# Patient Record
Sex: Male | Born: 1985 | Race: White | Hispanic: No | Marital: Single | Smoking: Never smoker
Health system: Southern US, Community
[De-identification: ages and names within clinical notes are randomized; demographics above are authoritative.]

## PROBLEM LIST (undated history)

## (undated) ENCOUNTER — Ambulatory Visit (HOSPITAL_COMMUNITY)
Admission: EM | Discharge status: Absent without leave | Payer: No Typology Code available for payment source | Source: Home / Self Care

## (undated) DIAGNOSIS — I10 Essential (primary) hypertension: Secondary | ICD-10-CM

## (undated) DIAGNOSIS — F419 Anxiety disorder, unspecified: Secondary | ICD-10-CM

## (undated) HISTORY — DX: Anxiety disorder, unspecified: F41.9

---

## 2014-06-02 ENCOUNTER — Encounter: Payer: Self-pay | Admitting: Primary Care

## 2014-06-02 ENCOUNTER — Encounter (INDEPENDENT_AMBULATORY_CARE_PROVIDER_SITE_OTHER): Payer: Self-pay

## 2014-06-02 ENCOUNTER — Ambulatory Visit (INDEPENDENT_AMBULATORY_CARE_PROVIDER_SITE_OTHER): Payer: No Typology Code available for payment source | Admitting: Primary Care

## 2014-06-02 VITALS — BP 138/88 | HR 90 | Temp 98.6°F | Ht 71.75 in | Wt 237.8 lb

## 2014-06-02 DIAGNOSIS — F410 Panic disorder [episodic paroxysmal anxiety] without agoraphobia: Secondary | ICD-10-CM | POA: Diagnosis not present

## 2014-06-02 MED ORDER — CLONAZEPAM 1 MG PO TABS: ORAL_TABLET | ORAL | Status: DC

## 2014-06-02 NOTE — Progress Notes (Signed)
Pre visit review using our clinic review tool, if applicable. No additional management support is needed unless otherwise documented below in the visit note. 

## 2014-06-02 NOTE — Assessment & Plan Note (Addendum)
Infrequent for the most part but due to recent stress has had to use Clonazepam more frequently. Increased dose to 1mg  and instructed to take once daily with his panic attacks. We discussed that the clonazepam was a temporary fix and that he will most likely need to be managed with an SSRI or SNRI soon if symptoms continue to progress (and needing his clonazepam more often). Was once on Zoloft, didn't like the way he felt. Refilled his clonazepam and will have controlled substance contract and UDS performed prior to providing prescription. He was unable to do this today, so his prescription was held until he can get this completed.

## 2014-06-02 NOTE — Progress Notes (Signed)
Subjective:    Patient ID: Lucas Barrett, male    DOB: 06/30/1985, 29 y.o.   MRN: [id number]  HPI  Lucas Barrett is a 29 year old male who presents today to establish care and discuss the problems mentioned below. Will obtain old records.  1) Elevated Blood Pressure Readings: He's had a few sporadic readings at his [facility] during his annual labs. He has no symptoms of chest pain, shortness of breath, daily headaches or edema.   2) Migraines: Will have them once every 2-3 months. He has a prescription for sumatriptan which he uses only during a severe migraine. During his migraines he will have photophobia, phonophobia, and will have to seclude himself in a dark room. 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 When he does have headaches other than migraines he will takeTylenol and ibuprofen with relief.  3) Panic Attacks: He was diagnosed 2 years ago and was placed on clonazepam 0.5mg  by his prior PCP for panic attacks. His attack started while he was on the phone with a customer and suddenly felt palpations and severe anxiety. He reports his panic attacks are once to twice monthly but lately has experienced them more frequently (1-2 times weekly). He's going through some additional stress at the moment due to planning a wedding and finding somewhere to live due to an upcoming lease expiration. He's tried Zoloft in the past for 6 months and reports not liking the way he felt. He's been out of his Clonazepam for 2 weeks, but has not needed it. He's had to take two of the 0.5mg  tablets lately and is requesting to take one of the 1mg  tablet instead.  Review of Systems  Constitutional: Negative for fatigue and unexpected weight change.  HENT: Negative for rhinorrhea.   Respiratory: Negative for cough and shortness of breath.   Cardiovascular: Negative for chest pain.  Gastrointestinal: Negative for diarrhea and constipation.  Genitourinary:  Negative for dysuria and frequency.  Musculoskeletal: Negative for myalgias, back pain and arthralgias.       Chronic upper back pain from picking up boxes. Tolerable, controlled.  Skin: Negative for rash.  Neurological: Positive for headaches. Negative for dizziness.  Hematological: Negative for adenopathy.  Psychiatric/Behavioral: Negative for suicidal ideas.       Denies Depression. See HPI for anxiety.       Past Medical History  Diagnosis Date  . Anxiety     History   Social History  . Marital Status: Single    Spouse Name: N/A  . Number of Children: N/A  . Years of Education: N/A   Occupational History  . Not on file.   Social History Main Topics  . Smoking status: Never Smoker   . Smokeless tobacco: Not on file  . Alcohol Use: No  . Drug Use: No  . Sexual Activity: Not on file   Other Topics Concern  . Not on file   Social History Narrative   Engaged.   [place]   Works for [employer] for 3 years.   Had 2 dogs.   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   Enjoys hiking, selling items on [employer].    History reviewed. No pertinent past surgical history.  Family History  Problem Relation Age of Onset  . Hypertension Mother     No Known Allergies  No current outpatient prescriptions on file prior to visit.  No current facility-administered medications on file prior to visit.    BP 138/88 mmHg  Pulse 90  Temp(Src) 98.6 F (37 C) (Oral)  Ht 5' 11.75" (1.822 m)  Wt 237 lb 12.8 oz (107.865 kg)  BMI 32.49 kg/m2  SpO2 97%    Objective:   Physical Exam  Constitutional: He is oriented to person, place, and time. He appears well-developed.  HENT:  Head: Normocephalic.  Right Ear: External ear normal. Tympanic membrane is not erythematous and not bulging.  Left Ear: External ear normal. Tympanic membrane is not erythematous and not bulging.  Nose: Nose normal.  Mouth/Throat: Oropharynx is clear and moist.   Eyes: Conjunctivae are normal. Pupils are equal, round, and reactive to light.  Neck: Neck supple.  Cardiovascular: Normal rate and regular rhythm.   Pulmonary/Chest: Effort normal and breath sounds normal.  Abdominal: Soft. Bowel sounds are normal. There is no tenderness.  Lymphadenopathy:    He has no cervical adenopathy.  Neurological: He is alert and oriented to person, place, and time.  Skin: Skin is warm and dry.  Psychiatric: He has a normal mood and affect.          Assessment & Plan:

## 2014-06-02 NOTE — Patient Instructions (Addendum)
Refills have been provided for your Clonazepam. Please continue to use these as needed for panic attacks and continue using relaxation techniques to prevent attacks. This medication can become highly addictive, so use when needed.  Follow up in 6 weeks for re-evaluation of anxiety and for physical exam. Welcome to [facility]!  Panic Attacks Panic attacks are sudden, short-livedsurges of severe anxiety, fear, or discomfort. They may occur for no reason when you are relaxed, when you are anxious, or when you are sleeping. Panic attacks may occur for a number of reasons:   Healthy people occasionally have panic attacks in extreme, life-threatening situations, such as war or natural disasters. Normal anxiety is a protective mechanism of the body that helps [place] react to danger (fight or flight response).  Panic attacks are often seen with anxiety disorders, such as panic disorder, social anxiety disorder, generalized anxiety disorder, and phobias. Anxiety disorders cause excessive or uncontrollable anxiety. They may interfere with your relationships or other life activities.  Panic attacks are sometimes seen with other mental illnesses, such as depression and posttraumatic stress disorder.  Certain medical conditions, prescription medicines, and drugs of abuse can cause panic attacks. SYMPTOMS  Panic attacks start suddenly, peak within 20 minutes, and are accompanied by four or more of the following symptoms:  Pounding heart or fast heart rate (palpitations).  Sweating.  Trembling or shaking.  Shortness of breath or feeling smothered.  Feeling choked.  Chest pain or discomfort.  Nausea or strange feeling in your stomach.  Dizziness, light-headedness, or feeling like you will faint.  Chills or hot flushes.  Numbness or tingling in your lips or hands and feet.  Feeling that things are not real or feeling that you are not yourself.  Fear of losing control or going crazy.  Fear of  dying. Some of these symptoms can mimic serious medical conditions. For example, you may think you are having a heart attack. Although panic attacks can be very scary, they are not life threatening. DIAGNOSIS  Panic attacks are diagnosed through an assessment by your health care provider. Your health care provider will ask questions about your symptoms, such as where and when they occurred. Your health care provider will also ask about your medical history and use of alcohol and drugs, including prescription medicines. Your health care provider may order blood tests or other studies to rule out a serious medical condition. Your health care provider may refer you to a mental health professional for further evaluation. TREATMENT   Most healthy people who have one or two panic attacks in an extreme, life-threatening situation will not require treatment.  The treatment for panic attacks associated with anxiety disorders or other mental illness typically involves counseling with a mental health professional, medicine, or a combination of both. Your health care provider will help determine what treatment is best for you.  Panic attacks due to physical illness usually go away with treatment of the illness. If prescription medicine is causing panic attacks, talk with your health care provider about stopping the medicine, decreasing the dose, or substituting another medicine.  Panic attacks due to alcohol or drug abuse go away with abstinence. Some adults need professional help in order to stop drinking or using drugs. HOME CARE INSTRUCTIONS   Take all medicines as directed by your health care provider.   Schedule and attend follow-up visits as directed by your health care provider. It is important to keep all your appointments. SEEK MEDICAL CARE IF:  You are not able  to take your medicines as prescribed.  Your symptoms do not improve or get worse. SEEK IMMEDIATE MEDICAL CARE IF:   You experience  panic attack symptoms that are different than your usual symptoms.  You have serious thoughts about hurting yourself or others.  You are taking medicine for panic attacks and have a serious side effect. MAKE SURE YOU:  Understand these instructions.  Will watch your condition.  Will get help right away if you are not doing well or get worse. Document Released: 02/25/2005 Document Revised: 03/02/2013 Document Reviewed: 10/09/2012 [facility] Patient Information 2015 [place] [place]. This information is not intended to replace advice given to you by your health care provider. Make sure you discuss any questions you have with your health care provider.

## 2014-06-06 ENCOUNTER — Other Ambulatory Visit: Payer: No Typology Code available for payment source

## 2014-12-27 ENCOUNTER — Other Ambulatory Visit: Payer: Self-pay | Admitting: Family Medicine

## 2014-12-28 ENCOUNTER — Ambulatory Visit: Payer: Self-pay

## 2014-12-28 DIAGNOSIS — Z0000 Encounter for general adult medical examination without abnormal findings: Secondary | ICD-10-CM

## 2014-12-28 LAB — CMP12+LP+TP+TSH+6AC+CBC/D/PLT
A/G RATIO: 1.9 (ref 1.1–2.5)
ALK PHOS: 94 IU/L (ref 39–117)
ALT: 60 IU/L — ABNORMAL HIGH (ref 0–44)
AST: 37 IU/L (ref 0–40)
Albumin: 4.5 g/dL (ref 3.5–5.5)
BUN / CREAT RATIO: 10 (ref 8–19)
BUN: 12 mg/dL (ref 6–20)
Basophils Absolute: 0 10*3/uL (ref 0.0–0.2)
Basos: 0 %
Bilirubin Total: 0.4 mg/dL (ref 0.0–1.2)
Calcium: 10 mg/dL (ref 8.7–10.2)
Chloride: 99 mmol/L (ref 97–106)
Chol/HDL Ratio: 3.2 ratio units (ref 0.0–5.0)
Cholesterol, Total: 170 mg/dL (ref 100–199)
Creatinine, Ser: 1.15 mg/dL (ref 0.76–1.27)
EOS (ABSOLUTE): 0.1 10*3/uL (ref 0.0–0.4)
EOS: 1 %
Estimated CHD Risk: <0.5 times avg. (ref 0.0–1.0)
Free Thyroxine Index: 2 (ref 1.2–4.9)
GFR calc non Af Amer: 84 mL/min/{1.73_m2} (ref >59)
GFR, EST AFRICAN AMERICAN: 97 mL/min/{1.73_m2} (ref >59)
GGT: 106 IU/L — ABNORMAL HIGH (ref 0–65)
GLOBULIN, TOTAL: 2.4 g/dL (ref 1.5–4.5)
GLUCOSE: 144 mg/dL — AB (ref 65–99)
HDL: 53 mg/dL (ref >39)
HEMOGLOBIN: 16.1 g/dL (ref 12.6–17.7)
Hematocrit: 47.8 % (ref 37.5–51.0)
IMMATURE GRANS (ABS): 0.1 10*3/uL (ref 0.0–0.1)
Immature Granulocytes: 1 %
Iron: 108 ug/dL (ref 38–169)
LDH: 213 IU/L (ref 121–224)
LDL CALC: 102 mg/dL — AB (ref 0–99)
Lymphocytes Absolute: 2.6 10*3/uL (ref 0.7–3.1)
Lymphs: 24 %
MCH: 29.9 pg (ref 26.6–33.0)
MCHC: 33.7 g/dL (ref 31.5–35.7)
MCV: 89 fL (ref 79–97)
MONOCYTES: 9 %
Monocytes Absolute: 1 10*3/uL — ABNORMAL HIGH (ref 0.1–0.9)
NEUTROS ABS: 7.1 10*3/uL — AB (ref 1.4–7.0)
Neutrophils: 65 %
POTASSIUM: 4.2 mmol/L (ref 3.5–5.2)
Phosphorus: 3 mg/dL (ref 2.5–4.5)
Platelets: 313 10*3/uL (ref 150–379)
RBC: 5.38 x10E6/uL (ref 4.14–5.80)
RDW: 13.1 % (ref 12.3–15.4)
Sodium: 141 mmol/L (ref 136–144)
T3 Uptake Ratio: 28 % (ref 24–39)
T4, Total: 7.1 ug/dL (ref 4.5–12.0)
TRIGLYCERIDES: 77 mg/dL (ref 0–149)
TSH: 2.83 u[IU]/mL (ref 0.450–4.500)
Total Protein: 6.9 g/dL (ref 6.0–8.5)
URIC ACID: 7.5 mg/dL (ref 3.7–8.6)
VLDL CHOLESTEROL CAL: 15 mg/dL (ref 5–40)
WBC: 10.9 10*3/uL — AB (ref 3.4–10.8)

## 2014-12-28 LAB — HGB A1C W/O EAG: Hgb A1c MFr Bld: 5.8 % — ABNORMAL HIGH (ref 4.8–5.6)

## 2014-12-28 NOTE — Progress Notes (Unsigned)
Patient ID: Lucas Barrett, male   DOB: 05/13/1985, 29 y.o.   MRN: [id number]  Employee here for Health Risk Assessment review of labs.  Advised of results, instructions given for necessary steps to complete assessment.  Nutrition counseling provided re elevated A1c.  BMI  33  Neck 18, Waist 38, Hip 45

## 2015-04-04 ENCOUNTER — Encounter (HOSPITAL_COMMUNITY): Payer: Self-pay | Admitting: Emergency Medicine

## 2015-04-04 ENCOUNTER — Emergency Department (HOSPITAL_COMMUNITY): Payer: No Typology Code available for payment source

## 2015-04-04 ENCOUNTER — Emergency Department (HOSPITAL_COMMUNITY)
Admission: EM | Admit: 2015-04-04 | Discharge: 2015-04-04 | Disposition: A | Discharge status: Home / Self Care | Payer: No Typology Code available for payment source | Attending: Emergency Medicine | Admitting: Emergency Medicine

## 2015-04-04 DIAGNOSIS — R61 Generalized hyperhidrosis: Secondary | ICD-10-CM | POA: Insufficient documentation

## 2015-04-04 DIAGNOSIS — R0789 Other chest pain: Secondary | ICD-10-CM | POA: Insufficient documentation

## 2015-04-04 DIAGNOSIS — F419 Anxiety disorder, unspecified: Secondary | ICD-10-CM | POA: Insufficient documentation

## 2015-04-04 DIAGNOSIS — I10 Essential (primary) hypertension: Secondary | ICD-10-CM | POA: Insufficient documentation

## 2015-04-04 DIAGNOSIS — R079 Chest pain, unspecified: Secondary | ICD-10-CM | POA: Diagnosis present

## 2015-04-04 DIAGNOSIS — R0602 Shortness of breath: Secondary | ICD-10-CM | POA: Insufficient documentation

## 2015-04-04 HISTORY — DX: Essential (primary) hypertension: I10

## 2015-04-04 LAB — I-STAT TROPONIN, ED: TROPONIN I, POC: 0 ng/mL (ref 0.00–0.08)

## 2015-04-04 LAB — BASIC METABOLIC PANEL
ANION GAP: 11 (ref 5–15)
BUN: 11 mg/dL (ref 6–20)
CO2: 25 mmol/L (ref 22–32)
Calcium: 9.9 mg/dL (ref 8.9–10.3)
Chloride: 106 mmol/L (ref 101–111)
Creatinine, Ser: 1.38 mg/dL — ABNORMAL HIGH (ref 0.61–1.24)
GLUCOSE: 123 mg/dL — AB (ref 65–99)
POTASSIUM: 3.7 mmol/L (ref 3.5–5.1)
Sodium: 142 mmol/L (ref 135–145)

## 2015-04-04 LAB — CBC
HEMATOCRIT: 49.4 % (ref 39.0–52.0)
HEMOGLOBIN: 17.1 g/dL — AB (ref 13.0–17.0)
MCH: 30.3 pg (ref 26.0–34.0)
MCHC: 34.6 g/dL (ref 30.0–36.0)
MCV: 87.4 fL (ref 78.0–100.0)
Platelets: 288 10*3/uL (ref 150–400)
RBC: 5.65 MIL/uL (ref 4.22–5.81)
RDW: 13.4 % (ref 11.5–15.5)
WBC: 9.5 10*3/uL (ref 4.0–10.5)

## 2015-04-04 NOTE — ED Notes (Signed)
Patient states started having L chest pain with shortness of breath while painting yesterday.  Patient states some diaphoresis yesterday.   Patient states all are intermittent.   Denies other symptoms.

## 2015-04-04 NOTE — Discharge Instructions (Signed)
Please read and follow all provided instructions.  Your diagnoses today include:  1. Chest wall pain    Tests performed today include:  An EKG of your heart  A chest x-ray  Cardiac enzymes - a blood test for heart muscle damage  Blood counts and electrolytes  Vital signs. See below for your results today.   Medications prescribed:   Take any prescribed medications only as directed.  Follow-up instructions: Please follow-up with your primary care provider as soon as you can for further evaluation of your symptoms.   Return instructions:  SEEK IMMEDIATE MEDICAL ATTENTION IF:  You have severe chest pain, especially if the pain is crushing or pressure-like and spreads to the arms, back, neck, or jaw, or if you have sweating, nausea (feeling sick to your stomach), or shortness of breath. THIS IS AN EMERGENCY. Don't wait to see if the pain will go away. Get medical help at once. Call 911 or 0 (operator). DO NOT drive yourself to the hospital.   Your chest pain gets worse and does not go away with rest.   You have an attack of chest pain lasting longer than usual, despite rest and treatment with the medications your caregiver has prescribed.   You wake from sleep with chest pain or shortness of breath.  You feel dizzy or faint.  You have chest pain not typical of your usual pain for which you originally saw your caregiver.   You have any other emergent concerns regarding your health.  Additional Information: Chest pain comes from many different causes. Your caregiver has diagnosed you as having chest pain that is not specific for one problem, but does not require admission.  You are at low risk for an acute heart condition or other serious illness.   Your vital signs today were: BP 140/81 mmHg   Pulse 90   Temp(Src) 97.9 F (36.6 C) (Oral)   Resp 18   Ht  (1.803 m)   Wt 108.863 kg   BMI 33.49 kg/m2   SpO2 98% If your blood pressure (BP) was elevated above 135/85 this  visit, please have this repeated by your doctor within one month. --------------

## 2015-04-04 NOTE — ED Provider Notes (Signed)
CSN: [id number]     Arrival date & time 04/04/15  1212 History   First MD Initiated Contact with Patient 04/04/15 1647     Chief Complaint  Patient presents with  . Chest Pain  . Shortness of Breath   (Consider location/radiation/quality/duration/timing/severity/associated sxs/prior Treatment) HPI 30 y.o. male with a hx of anxiety, presents to the Emergency Department today complaining of chest pain and shortness of breath that began yesterday afternoon. States that he was painting the house when he felt a cramping sensation on his left chest. 6/10 pain with no radiation. Had associated diaphoresis. Currently pain is 2/10. After the event he took some ibuprofen and sat in front of a fan with minimal relief. Notes that he was working out on Sunday, but did not feel symptoms at that time. Does not feel like muscle strain in the past. No N/V/D, No headaches, no vision changes, no abd pain. No hx MI. Otherwise health and in NAD.     Past Medical History  Diagnosis Date  . Anxiety   . Hypertension    History reviewed. No pertinent past surgical history. Family History  Problem Relation Age of Onset  . Hypertension Mother    Social History  Substance Use Topics  . Smoking status: Never Smoker   . Smokeless tobacco: None  . Alcohol Use: No    Review of Systems ROS reviewed and all are negative for acute change except as noted in the HPI.  Allergies  Review of patient's allergies indicates no known allergies.  Home Medications   Prior to Admission medications   Medication Sig Start Date End Date Taking? Authorizing Provider  clonazePAM (KLONOPIN) 1 MG tablet Take one tablet my mouth once daily as needed for panic attacks. 06/02/14   Doreene Nest, NP   BP 140/81 mmHg  Pulse 90  Temp(Src) 97.9 F (36.6 C) (Oral)  Resp 18  Ht  (1.803 m)  Wt 108.863 kg  BMI 33.49 kg/m2  SpO2 98%   Physical Exam  Constitutional: He is oriented to person, place, and time. He appears  well-developed and well-nourished.  HENT:  Head: Normocephalic and atraumatic.  Eyes: EOM are normal.  Neck: Normal range of motion.  Cardiovascular: Normal rate and regular rhythm.   Pulmonary/Chest: Effort normal. He exhibits tenderness.  Tenderness on palpation of L chest mid clavicular 3-4th rib space   Abdominal: Soft.  Musculoskeletal: Normal range of motion.  Neurological: He is alert and oriented to person, place, and time.  Skin: Skin is warm and dry.  Psychiatric: He has a normal mood and affect. His behavior is normal. Thought content normal.  Nursing note and vitals reviewed.  ED Course  Procedures (including critical care time) Labs Review Labs Reviewed  BASIC METABOLIC PANEL - Abnormal; Notable for the following:    Glucose, Bld 123 (*)    Creatinine, Ser 1.38 (*)    All other components within normal limits  CBC - Abnormal; Notable for the following:    Hemoglobin 17.1 (*)    All other components within normal limits  I-STAT TROPOININ, ED    Imaging Review Dg Chest 2 View  04/04/2015  CLINICAL DATA:  Central and left-sided chest pain and shortness of breath for the past 2 days. EXAM: CHEST  2 VIEW COMPARISON:  None. FINDINGS: Normal sized heart. Clear lungs. Minimal diffuse peribronchial thickening. Unremarkable bones. IMPRESSION: Minimal bronchitic changes. Electronically Signed   By: Beckie Salts M.D.   On: 04/04/2015 13:14  I have personally reviewed and evaluated these images and lab results as part of my medical decision-making.   EKG Interpretation   Date/Time:  Tuesday April 04 2015 12:21:02 EST Ventricular Rate:  111 PR Interval:  124 QRS Duration: 92 QT Interval:  324 QTC Calculation: 440 R Axis:   -32 Text Interpretation:  Sinus tachycardia Left axis deviation Abnormal ECG  No previous ECGs available Interpretation limited secondary to artifact  Confirmed by ZACKOWSKI  MD, SCOTT [phone]) on 04/04/2015 5:20:51 PM      MDM  I have reviewed  relevant laboratory values. I have reviewed relevant imaging studies. I personally interpreted the relevant EKG. I have reviewed the relevant previous healthcare records. I obtained HPI from historian.  ED Course:  Assessment: 58y M presents with chest pain that began yesterday. Patient is to be discharged with recommendation to follow up with PCP in regards to today's hospital visit. Chest pain is not likely of cardiac or pulmonary etiology d/t presentation, perc negative, VSS, no tracheal deviation, no JVD or new murmur, RRR, breath sounds equal bilaterally, EKG without acute abnormalities, negative troponin, and CXR which showed minimal bronchitic changes. Pt has been advised to return to the ED is CP becomes exertional, associated with diaphoresis or nausea, radiates to left jaw/arm, worsens or becomes concerning in any way. Pt appears reliable for follow up and is agreeable to discharge. Patient is in no acute distress. Vital Signs are stable. Patient is able to ambulate. Patient able to tolerate PO.    Disposition/Plan:  DC Home Additional Verbal discharge instructions given and discussed with patient.  Pt Instructed to f/u with PCP in the next 48-72 hours for evaluation and treatment of symptoms. Return precautions given Pt acknowledges and agrees with plan   Supervising Physician Vanetta Mulders, MD    Final diagnoses:  Chest wall pain      Audry Pili, PA-C 04/04/15 1742  Vanetta Mulders, MD 04/08/15 [phone]

## 2016-12-16 ENCOUNTER — Ambulatory Visit: Payer: Self-pay | Admitting: *Deleted

## 2016-12-16 VITALS — BP 128/87 | HR 107 | Ht 72.0 in | Wt 235.0 lb

## 2016-12-16 DIAGNOSIS — Z0000 Encounter for general adult medical examination without abnormal findings: Secondary | ICD-10-CM

## 2016-12-16 NOTE — Progress Notes (Signed)
[employer] insurance premium discount evaluation: Labs Drawn. Replacements ROI form signed. Tobacco Free Attestation form signed.  Forms placed in paper chart.  No PCP to route results to per pt.

## 2016-12-17 LAB — CMP12+LP+TP+TSH+6AC+CBC/D/PLT
A/G RATIO: 1.8 (ref 1.2–2.2)
ALBUMIN: 4.6 g/dL (ref 3.5–5.5)
ALT: 276 IU/L — ABNORMAL HIGH (ref 0–44)
AST: 93 IU/L — ABNORMAL HIGH (ref 0–40)
Alkaline Phosphatase: 98 IU/L (ref 39–117)
BUN/Creatinine Ratio: 13 (ref 9–20)
BUN: 20 mg/dL (ref 6–20)
Basophils Absolute: 0 10*3/uL (ref 0.0–0.2)
Basos: 0 %
Bilirubin Total: 0.8 mg/dL (ref 0.0–1.2)
CHOLESTEROL TOTAL: 363 mg/dL — AB (ref 100–199)
CREATININE: 1.55 mg/dL — AB (ref 0.76–1.27)
Calcium: 9.9 mg/dL (ref 8.7–10.2)
Chloride: 99 mmol/L (ref 96–106)
Chol/HDL Ratio: 13 ratio — ABNORMAL HIGH (ref 0.0–5.0)
EOS (ABSOLUTE): 0.1 10*3/uL (ref 0.0–0.4)
Eos: 1 %
Estimated CHD Risk: 2.3 times avg. — ABNORMAL HIGH (ref 0.0–1.0)
Free Thyroxine Index: 1.9 (ref 1.2–4.9)
GFR calc Af Amer: 67 mL/min/{1.73_m2} (ref >59)
GFR, EST NON AFRICAN AMERICAN: 58 mL/min/{1.73_m2} — AB (ref >59)
GGT: 66 IU/L — AB (ref 0–65)
GLOBULIN, TOTAL: 2.6 g/dL (ref 1.5–4.5)
GLUCOSE: 105 mg/dL — AB (ref 65–99)
HDL: 28 mg/dL — AB (ref >39)
Hematocrit: 50.1 % (ref 37.5–51.0)
Hemoglobin: 16.6 g/dL (ref 13.0–17.7)
IRON: 174 ug/dL — AB (ref 38–169)
Immature Grans (Abs): 0.1 10*3/uL (ref 0.0–0.1)
Immature Granulocytes: 1 %
LDH: 202 IU/L (ref 121–224)
LDL Calculated: 301 mg/dL — ABNORMAL HIGH (ref 0–99)
LYMPHS: 23 %
Lymphocytes Absolute: 2.2 10*3/uL (ref 0.7–3.1)
MCH: 29.2 pg (ref 26.6–33.0)
MCHC: 33.1 g/dL (ref 31.5–35.7)
MCV: 88 fL (ref 79–97)
MONOS ABS: 0.8 10*3/uL (ref 0.1–0.9)
Monocytes: 9 %
NEUTROS ABS: 6.4 10*3/uL (ref 1.4–7.0)
Neutrophils: 66 %
PHOSPHORUS: 2.9 mg/dL (ref 2.5–4.5)
PLATELETS: 399 10*3/uL — AB (ref 150–379)
POTASSIUM: 4.6 mmol/L (ref 3.5–5.2)
RBC: 5.68 x10E6/uL (ref 4.14–5.80)
RDW: 14.7 % (ref 12.3–15.4)
Sodium: 141 mmol/L (ref 134–144)
T3 UPTAKE RATIO: 30 % (ref 24–39)
T4 TOTAL: 6.3 ug/dL (ref 4.5–12.0)
TOTAL PROTEIN: 7.2 g/dL (ref 6.0–8.5)
TSH: 2.1 u[IU]/mL (ref 0.450–4.500)
Triglycerides: 170 mg/dL — ABNORMAL HIGH (ref 0–149)
URIC ACID: 6.7 mg/dL (ref 3.7–8.6)
VLDL Cholesterol Cal: 34 mg/dL (ref 5–40)
WBC: 9.5 10*3/uL (ref 3.4–10.8)

## 2016-12-17 LAB — HGB A1C W/O EAG: HEMOGLOBIN A1C: 5 % (ref 4.8–5.6)

## 2016-12-17 NOTE — Progress Notes (Signed)
Results and NP notes reviewed with pt. Pt reports taking supplements from [facility], "creatine and other stuff like that for working out and building muscle." Advised pt to stop supplements and repeat labs in 1 month per NP. Pt has not taken supplements for 1.5 weeks since before going on vacation. Appt made for 4 weeks from now for repeat fasting labs. Iron increased. Possibly r/t supplements. [place]. Risk of and sx of blood clots discussed 2/2 elevated platelets. Avoid dehydration. Copy of labs provided to pt. No pcp to route results to. No further questions/concerns.

## 2017-01-21 ENCOUNTER — Ambulatory Visit: Payer: Self-pay | Admitting: Registered Nurse

## 2017-01-21 ENCOUNTER — Encounter: Payer: Self-pay | Admitting: Registered Nurse

## 2017-01-21 VITALS — BP 123/82 | HR 82 | Temp 98.8°F | Resp 16

## 2017-01-21 DIAGNOSIS — J019 Acute sinusitis, unspecified: Secondary | ICD-10-CM

## 2017-01-21 DIAGNOSIS — J029 Acute pharyngitis, unspecified: Secondary | ICD-10-CM

## 2017-01-21 DIAGNOSIS — H6692 Otitis media, unspecified, left ear: Secondary | ICD-10-CM

## 2017-01-21 MED ORDER — SALINE SPRAY 0.65 % NA SOLN: 2.0000 | NASAL | 0 refills | Status: DC

## 2017-01-21 MED ORDER — FLUTICASONE PROPIONATE 50 MCG/ACT NA SUSP: 1.0000 | Freq: Two times a day (BID) | NASAL | 0 refills | Status: DC

## 2017-01-21 MED ORDER — AMOXICILLIN-POT CLAVULANATE 875-125 MG PO TABS: 1.0000 | ORAL_TABLET | Freq: Two times a day (BID) | ORAL | 0 refills | Status: AC

## 2017-01-21 NOTE — Patient Instructions (Signed)
Sinus Rinse What is a sinus rinse? A sinus rinse is a simple home treatment that is used to rinse your sinuses with a sterile mixture of salt and water (saline solution). Sinuses are air-filled spaces in your skull behind the bones of your face and forehead that open into your nasal cavity. You will use the following:  Saline solution.  Neti pot or spray bottle. This releases the saline solution into your nose and through your sinuses. Neti pots and spray bottles can be purchased at [employer], a health food store, or online.  When would I do a sinus rinse? A sinus rinse can help to clear mucus, dirt, dust, or pollen from the nasal cavity. You may do a sinus rinse when you have a cold, a virus, nasal allergy symptoms, a sinus infection, or stuffiness in the nose or sinuses. If you are considering a sinus rinse:  Ask your child's health care provider before performing a sinus rinse on your child.  Do not do a sinus rinse if you have had ear or nasal surgery, ear infection, or blocked ears.  How do I do a sinus rinse?  Wash your hands.  Disinfect your device according to the directions provided and then dry it.  Use the solution that comes with your device or one that is sold separately in stores. Follow the mixing directions on the package.  Fill your device with the amount of saline solution as directed by the device instructions.  Stand over a sink and tilt your head sideways over the sink.  Place the spout of the device in your upper nostril (the one closer to the ceiling).  Gently pour or squeeze the saline solution into the nasal cavity. The liquid should drain to the lower nostril if you are not overly congested.  Gently blow your nose. Blowing too hard may cause ear pain.  Repeat in the other nostril.  Clean and rinse your device with clean water and then air-dry it. Are there risks of a sinus rinse? Sinus rinse is generally very safe and effective. However,  there are a few risks, which include:  A burning sensation in the sinuses. This may happen if you do not make the saline solution as directed. Make sure to follow all directions when making the saline solution.  Infection from contaminated water. This is rare, but possible.  Nasal irritation.  This information is not intended to replace advice given to you by your health care provider. Make sure you discuss any questions you have with your health care provider. Document Released: 09/22/2013 Document Revised: 01/23/2016 Document Reviewed: 07/13/2013 Elsevier Interactive Patient Education  2017 [employer] Sinusitis, Adult Sinusitis is soreness and inflammation of your sinuses. Sinuses are hollow spaces in the bones around your face. Your sinuses are located:  Around your eyes.  In the middle of your forehead.  Behind your nose.  In your cheekbones.  Your sinuses and nasal passages are lined with a stringy fluid (mucus). Mucus normally drains out of your sinuses. When your nasal tissues become inflamed or swollen, the mucus can become trapped or blocked so air cannot flow through your sinuses. This allows bacteria, viruses, and funguses to grow, which leads to infection. Sinusitis can develop quickly and last for 7?10 days (acute) or for more than 12 weeks (chronic). Sinusitis often develops after a cold. What are the causes? This condition is caused by anything that creates swelling in the sinuses or stops mucus from draining, including:  Allergies.  Asthma.  Bacterial or viral infection.  Abnormally shaped bones between the nasal passages.  Nasal growths that contain mucus (nasal polyps).  Narrow sinus openings.  Pollutants, such as chemicals or irritants in the air.  A foreign object stuck in the nose.  A fungal infection. This is rare.  What increases the risk? The following factors may make you more likely to develop this condition:  Having allergies or  asthma.  Having had a recent cold or respiratory tract infection.  Having structural deformities or blockages in your nose or sinuses.  Having a weak immune system.  Doing a lot of swimming or diving.  Overusing nasal sprays.  Smoking.  What are the signs or symptoms? The main symptoms of this condition are pain and a feeling of pressure around the affected sinuses. Other symptoms include:  Upper toothache.  Earache.  Headache.  Bad breath.  Decreased sense of smell and taste.  A cough that may get worse at night.  Fatigue.  Fever.  Thick drainage from your nose. The drainage is often green and it may contain pus (purulent).  Stuffy nose or congestion.  Postnasal drip. This is when extra mucus collects in the throat or back of the nose.  Swelling and warmth over the affected sinuses.  Sore throat.  Sensitivity to light.  How is this diagnosed? This condition is diagnosed based on symptoms, a medical history, and a physical exam. To find out if your condition is acute or chronic, your health care provider may:  Look in your nose for signs of nasal polyps.  Tap over the affected sinus to check for signs of infection.  View the inside of your sinuses using an imaging device that has a light attached (endoscope).  If your health care provider suspects that you have chronic sinusitis, you may also:  Be tested for allergies.  Have a sample of mucus taken from your nose (nasal culture) and checked for bacteria.  Have a mucus sample examined to see if your sinusitis is related to an allergy.  If your sinusitis does not respond to treatment and it lasts longer than 8 weeks, you may have an MRI or CT scan to check your sinuses. These scans also help to determine how severe your infection is. In rare cases, a bone biopsy may be done to rule out more serious types of fungal sinus disease. How is this treated? Treatment for sinusitis depends on the cause and  whether your condition is chronic or acute. If a virus is causing your sinusitis, your symptoms will go away on their own within 10 days. You may be given medicines to relieve your symptoms, including:  Topical nasal decongestants. They shrink swollen nasal passages and let mucus drain from your sinuses.  Antihistamines. These drugs block inflammation that is triggered by allergies. This can help to ease swelling in your nose and sinuses.  Topical nasal corticosteroids. These are nasal sprays that ease inflammation and swelling in your nose and sinuses.  Nasal saline washes. These rinses can help to get rid of thick mucus in your nose.  If your condition is caused by bacteria, you will be given an antibiotic medicine. If your condition is caused by a fungus, you will be given an antifungal medicine. Surgery may be needed to correct underlying conditions, such as narrow nasal passages. Surgery may also be needed to remove polyps. Follow these instructions at home: Medicines  Take, use, or apply over-the-counter and prescription medicines only as told by   your health care provider. These may include nasal sprays.  If you were prescribed an antibiotic medicine, take it as told by your health care provider. Do not stop taking the antibiotic even if you start to feel better. Hydrate and Humidify  Drink enough water to keep your urine clear or pale yellow. Staying hydrated will help to thin your mucus.  Use a cool mist humidifier to keep the humidity level in your home above 50%.  Inhale steam for 10-15 minutes, 3-4 times a day or as told by your health care provider. You can do this in the bathroom while a hot shower is running.  Limit your exposure to cool or dry air. Rest  Rest as much as possible.  Sleep with your head raised (elevated).  Make sure to get enough sleep each night. General instructions  Apply a warm, moist washcloth to your face 3-4 times a day or as told by your  health care provider. This will help with discomfort.  Wash your hands often with soap and water to reduce your exposure to viruses and other germs. If soap and water are not available, use hand sanitizer.  Do not smoke. Avoid being around people who are smoking (secondhand smoke).  Keep all follow-up visits as told by your health care provider. This is important. Contact a health care provider if:  You have a fever.  Your symptoms get worse.  Your symptoms do not improve within 10 days. Get help right away if:  You have a severe headache.  You have persistent vomiting.  You have pain or swelling around your face or eyes.  You have vision problems.  You develop confusion.  Your neck is stiff.  You have trouble breathing. This information is not intended to replace advice given to you by your health care provider. Make sure you discuss any questions you have with your health care provider. Document Released: 02/25/2005 Document Revised: 10/22/2015 Document Reviewed: 12/21/2014 Elsevier Interactive Patient Education  2017 [employer] Allergic Rhinitis Allergic rhinitis is when the mucous membranes in the nose respond to allergens. Allergens are particles in the air that cause your body to have an allergic reaction. This causes you to release allergic antibodies. Through a chain of events, these eventually cause you to release histamine into the blood stream. Although meant to protect the body, it is this release of histamine that causes your discomfort, such as frequent sneezing, congestion, and an itchy, runny nose. What are the causes? Seasonal allergic rhinitis (hay fever) is caused by pollen allergens that may come from grasses, trees, and weeds. Year-round allergic rhinitis (perennial allergic rhinitis) is caused by allergens such as house dust mites, pet dander, and mold spores. What are the signs or symptoms?  Nasal stuffiness (congestion).  Itchy, runny nose with  sneezing and tearing of the eyes. How is this diagnosed? Your health care provider can help you determine the allergen or allergens that trigger your symptoms. If you and your health care provider are unable to determine the allergen, skin or blood testing may be used. Your health care provider will diagnose your condition after taking your health history and performing a physical exam. Your health care provider may assess you for other related conditions, such as asthma, pink eye, or an ear infection. How is this treated? Allergic rhinitis does not have a cure, but it can be controlled by:  Medicines that block allergy symptoms. These may include allergy shots, nasal sprays, and oral antihistamines.  Avoiding the  allergen.  Hay fever may often be treated with antihistamines in pill or nasal spray forms. Antihistamines block the effects of histamine. There are over-the-counter medicines that may help with nasal congestion and swelling around the eyes. Check with your health care provider before taking or giving this medicine. If avoiding the allergen or the medicine prescribed do not work, there are many new medicines your health care provider can prescribe. Stronger medicine may be used if initial measures are ineffective. Desensitizing injections can be used if medicine and avoidance does not work. Desensitization is when a patient is given ongoing shots until the body becomes less sensitive to the allergen. Make sure you follow up with your health care provider if problems continue. Follow these instructions at home: It is not possible to completely avoid allergens, but you can reduce your symptoms by taking steps to limit your exposure to them. It helps to know exactly what you are allergic to so that you can avoid your specific triggers. Contact a health care provider if:  You have a fever.  You develop a cough that does not stop easily (persistent).  You have shortness of breath.  You  start wheezing.  Symptoms interfere with normal daily activities. This information is not intended to replace advice given to you by your health care provider. Make sure you discuss any questions you have with your health care provider. Document Released: 11/20/2000 Document Revised: 10/27/2015 Document Reviewed: 11/02/2012 Elsevier Interactive Patient Education  2017 [employer] Pharyngitis Pharyngitis is redness, pain, and swelling (inflammation) of your pharynx. What are the causes? Pharyngitis is usually caused by infection. Most of the time, these infections are from viruses (viral) and are part of a cold. However, sometimes pharyngitis is caused by bacteria (bacterial). Pharyngitis can also be caused by allergies. Viral pharyngitis may be spread from person to person by coughing, sneezing, and personal items or utensils (cups, forks, spoons, toothbrushes). Bacterial pharyngitis may be spread from person to person by more intimate contact, such as kissing. What are the signs or symptoms? Symptoms of pharyngitis include:  Sore throat.  Tiredness (fatigue).  Low-grade fever.  Headache.  Joint pain and muscle aches.  Skin rashes.  Swollen lymph nodes.  Plaque-like film on throat or tonsils (often seen with bacterial pharyngitis).  How is this diagnosed? Your health care provider will ask you questions about your illness and your symptoms. Your medical history, along with a physical exam, is often all that is needed to diagnose pharyngitis. Sometimes, a rapid strep test is done. Other lab tests may also be done, depending on the suspected cause. How is this treated? Viral pharyngitis will usually get better in 3-4 days without the use of medicine. Bacterial pharyngitis is treated with medicines that kill germs (antibiotics). Follow these instructions at home:  Drink enough water and fluids to keep your urine clear or pale yellow.  Only take over-the-counter or prescription  medicines as directed by your health care provider: ? If you are prescribed antibiotics, make sure you finish them even if you start to feel better. ? Do not take aspirin.  Get lots of rest.  Gargle with 8 oz of salt water ( tsp of salt per 1 qt of water) as often as every 1-2 hours to soothe your throat.  Throat lozenges (if you are not at risk for choking) or sprays may be used to soothe your throat. Contact a health care provider if:  You have large, tender lumps in your neck.  You  have a rash.  You cough up green, yellow-brown, or bloody spit. Get help right away if:  Your neck becomes stiff.  You drool or are unable to swallow liquids.  You vomit or are unable to keep medicines or liquids down.  You have severe pain that does not go away with the use of recommended medicines.  You have trouble breathing (not caused by a stuffy nose). This information is not intended to replace advice given to you by your health care provider. Make sure you discuss any questions you have with your health care provider. Document Released: 02/25/2005 Document Revised: 08/03/2015 Document Reviewed: 11/02/2012 Elsevier Interactive Patient Education  2017 [employer] Otitis Media, Adult Otitis media occurs when there is inflammation and fluid in the middle ear. Your middle ear is a part of the ear that contains bones for hearing as well as air that helps send sounds to your brain. What are the causes? This condition is caused by a blockage in the eustachian tube. This tube drains fluid from the ear to the back of the nose (nasopharynx). A blockage in this tube can be caused by an object or by swelling (edema) in the tube. Problems that can cause a blockage include:  A cold or other upper respiratory infection.  Allergies.  An irritant, such as tobacco smoke.  Enlarged adenoids. The adenoids are areas of soft tissue located high in the back of the throat, behind the nose and the roof of the  mouth.  A mass in the nasopharynx.  Damage to the ear caused by pressure changes (barotrauma).  What are the signs or symptoms? Symptoms of this condition include:  Ear pain.  A fever.  Decreased hearing.  A headache.  Tiredness (lethargy).  Fluid leaking from the ear.  Ringing in the ear.  How is this diagnosed? This condition is diagnosed with a physical exam. During the exam your health care provider will use an instrument called an otoscope to look into your ear and check for redness, swelling, and fluid. He or she will also ask about your symptoms. Your health care provider may also order tests, such as:  A test to check the movement of the eardrum (pneumatic otoscopy). This test is done by squeezing a small amount of air into the ear.  A test that changes air pressure in the middle ear to check how well the eardrum moves and whether the eustachian tube is working (tympanogram).  How is this treated? This condition usually goes away on its own within 3-5 days. But if the condition is caused by a bacteria infection and does not go away own its own, or keeps coming back, your health care provider may:  Prescribe antibiotic medicines to treat the infection.  Prescribe or recommend medicines to control pain.  Follow these instructions at home:  Take over-the-counter and prescription medicines only as told by your health care provider.  If you were prescribed an antibiotic medicine, take it as told by your health care provider. Do not stop taking the antibiotic even if you start to feel better.  Keep all follow-up visits as told by your health care provider. This is important. Contact a health care provider if:  You have bleeding from your nose.  There is a lump on your neck.  You are not getting better in 5 days.  You feel worse instead of better. Get help right away if:  You have severe pain that is not controlled with medicine.  You have  swelling, redness,  or pain around your ear.  You have stiffness in your neck.  A part of your face is paralyzed.  The bone behind your ear (mastoid) is tender when you touch it.  You develop a severe headache. Summary  Otitis media is redness, soreness, and swelling of the middle ear.  This condition usually goes away on its own within 3-5 days.  If the problem does not go away in 3-5 days, your health care provider may prescribe or recommend medicines to treat your symptoms.  If you were prescribed an antibiotic medicine, take it as told by your health care provider. This information is not intended to replace advice given to you by your health care provider. Make sure you discuss any questions you have with your health care provider. Document Released: 12/01/2003 Document Revised: 02/16/2016 Document Reviewed: 02/16/2016 [employer] Interactive Patient Education  2017 [employer]

## 2017-01-21 NOTE — Progress Notes (Signed)
Subjective:    Patient ID: Lucas Barrett, male    DOB: 05/24/1985, 31 y.o.   MRN: [id number]  31y/o caucasian established male patient here for left cervical lymph node swelling/pain and left ear pain.  Ran out of flonase and nasal saline needs refill.  Has been alternating antihistamines OTC po for runny nose and congestion.  Adenovirus common in community along with strep throat at this time and many coworkers have cough/runny nose.  Has been taking motrin OTC prn pain.  Denied cough/wheezing/ear discharge/drooling/vomiting/nausea/headache.        Review of Systems  Constitutional: Negative for activity change, appetite change, chills, diaphoresis, fatigue, fever and unexpected weight change.  HENT: Positive for congestion, ear pain, postnasal drip, rhinorrhea and sore throat. Negative for dental problem, drooling, ear discharge, facial swelling, hearing loss, mouth sores, nosebleeds, sinus pressure, sinus pain, sneezing, tinnitus, trouble swallowing and voice change.   Eyes: Negative for photophobia, pain, discharge, redness, itching and visual disturbance.  Respiratory: Negative for cough, choking, chest tightness, shortness of breath, wheezing and stridor.   Cardiovascular: Negative for chest pain, palpitations and leg swelling.  Gastrointestinal: Negative for abdominal distention, abdominal pain, blood in stool, constipation, diarrhea, nausea and vomiting.  Endocrine: Negative for cold intolerance and heat intolerance.  Genitourinary: Negative for dysuria.  Musculoskeletal: Positive for neck pain. Negative for arthralgias, back pain, gait problem, joint swelling, myalgias and neck stiffness.  Skin: Negative for color change, pallor, rash and wound.  Allergic/Immunologic: Positive for environmental allergies. Negative for food allergies and immunocompromised state.  Neurological: Negative for dizziness, tremors, seizures, syncope, facial asymmetry, speech difficulty, weakness,  light-headedness, numbness and headaches.  Hematological: Negative for adenopathy. Does not bruise/bleed easily.  Psychiatric/Behavioral: Negative for agitation, behavioral problems, confusion and sleep disturbance.       Objective:   Physical Exam  Constitutional: He is oriented to person, place, and time. Vital signs are normal. He appears well-developed and well-nourished. He is active and cooperative.  Non-toxic appearance. He does not have a sickly appearance. He appears ill. No distress.  HENT:  Head: Normocephalic and atraumatic.  Right Ear: Hearing, external ear and ear canal normal. A middle ear effusion is present.  Left Ear: Hearing and external ear normal. Tympanic membrane is injected, erythematous and bulging. A middle ear effusion is present.  Nose: Mucosal edema and rhinorrhea present. No nose lacerations, sinus tenderness, nasal deformity, septal deviation or nasal septal hematoma. No epistaxis.  No foreign bodies. Right sinus exhibits maxillary sinus tenderness. Right sinus exhibits no frontal sinus tenderness. Left sinus exhibits maxillary sinus tenderness. Left sinus exhibits no frontal sinus tenderness.  Mouth/Throat: Uvula is midline and mucous membranes are normal. Mucous membranes are not pale, not dry and not cyanotic. He does not have dentures. No oral lesions. No trismus in the jaw. Normal dentition. No dental abscesses, uvula swelling, lacerations or dental caries. Posterior oropharyngeal edema and posterior oropharyngeal erythema present. No oropharyngeal exudate or tonsillar abscesses.  Maxillary sinuses mildly ttp bilaterally; left anterior cervical lymph nodes TTP not shotty/palpable; left TM air fluid level clear; injection and bulging 12-7 oclock and vasculature inflamed centrally external auditory canal adjacent slight erythema 6 oclock; right TM air fluid level clear  Eyes: Conjunctivae, EOM and lids are normal. Pupils are equal, round, and reactive to light. Right  eye exhibits no chemosis, no discharge, no exudate and no hordeolum. No foreign body present in the right eye. Left eye exhibits no chemosis, no discharge, no exudate and no hordeolum. No foreign  body present in the left eye. Right conjunctiva is not injected. Right conjunctiva has no hemorrhage. Left conjunctiva is not injected. Left conjunctiva has no hemorrhage. No scleral icterus. Right eye exhibits normal extraocular motion and no nystagmus. Left eye exhibits normal extraocular motion and no nystagmus. Right pupil is round and reactive. Left pupil is round and reactive. Pupils are equal.  Neck: Trachea normal, normal range of motion and phonation normal. Neck supple. No tracheal tenderness and no muscular tenderness present. No neck rigidity. No tracheal deviation, no edema, no erythema and normal range of motion present. No thyroid mass and no thyromegaly present.  Cardiovascular: Normal rate, regular rhythm, S1 normal, S2 normal, normal heart sounds and intact distal pulses. PMI is not displaced. Exam reveals no gallop and no friction rub.  No murmur heard. Pulses:      Radial pulses are 2+ on the right side, and 2+ on the left side.  Pulmonary/Chest: Effort normal and breath sounds normal. No accessory muscle usage or stridor. No respiratory distress. He has no decreased breath sounds. He has no wheezes. He has no rhonchi. He has no rales.  Spoke full sentences without difficulty; no cough observed in exam room  Abdominal: Soft. Normal appearance. He exhibits no distension, no fluid wave and no ascites. There is no rigidity and no guarding.  Musculoskeletal: Normal range of motion. He exhibits no edema.       Right shoulder: Normal.       Left shoulder: Normal.       Right elbow: Normal.      Left elbow: Normal.       Right hip: Normal.       Left hip: Normal.       Right knee: Normal.       Left knee: Normal.       Cervical back: He exhibits tenderness and pain. He exhibits normal range of  motion, no bony tenderness, no swelling, no edema, no deformity, no laceration and no spasm.       Thoracic back: Normal.       Lumbar back: Normal.       Back:       Right hand: Normal.       Left hand: Normal.  Left anterior cervical lymph nodes and soft tissue adjacent TTP; full AROM c-spine  Lymphadenopathy:       Head (right side): No submental, no submandibular, no tonsillar, no preauricular, no posterior auricular and no occipital adenopathy present.       Head (left side): No submental, no submandibular, no tonsillar, no preauricular, no posterior auricular and no occipital adenopathy present.    He has cervical adenopathy.       Right cervical: No superficial cervical, no deep cervical and no posterior cervical adenopathy present.      Left cervical: Superficial cervical adenopathy present. No deep cervical and no posterior cervical adenopathy present.  Neurological: He is alert and oriented to person, place, and time. He has normal strength. He displays no atrophy and no tremor. No cranial nerve deficit or sensory deficit. He exhibits normal muscle tone. He displays no seizure activity. Coordination and gait normal. GCS eye subscore is 4. GCS verbal subscore is 5. GCS motor subscore is 6.  On/off exam table/in/out of chair without difficulty; gait sure and steady in hallway  Skin: Skin is warm, dry and intact. No abrasion, no bruising, no burn, no ecchymosis, no laceration, no lesion, no petechiae and no rash noted. He is  not diaphoretic. No cyanosis or erythema. No pallor. Nails show no clubbing.  Psychiatric: He has a normal mood and affect. His speech is normal and behavior is normal. Judgment and thought content normal. Cognition and memory are normal.  Nursing note and vitals reviewed.         Assessment & Plan:  A-left otitis media acute; acute rhinosinusitis; acute pharyngitis  P-Augmentin 875mg  po BID x 10 days #20 RF0 dispensed from [facility].  Has motrin at home 800mg  po  TID prn pain.  Supportive treatment.   No evidence of invasive bacterial infection, non toxic and well hydrated.  This is most likely self limiting viral infection.  I do not see where any further testing or imaging is necessary at this time.   I will suggest supportive care, rest, good hygiene and encourage the patient to take adequate fluids.  The patient is to return to clinic or EMERGENCY ROOM if symptoms worsen or change significantly e.g. ear pain, fever, purulent discharge from ears or bleeding.  Exitcare handout on otitis media given to patient.  Patient verbalized agreement and understanding of treatment plan.    Patient may use normal saline nasal spray 2 sprays each nostril q2h wa as needed 1 bottle dispensed from clinic stock. flonase 1 spray each nostril BID #1 RF0 dispensed from [facility]  Patient denied personal or family history of ENT cancer.  OTC antihistamine of choice claritin/zyrtec 10mg  po daily.  Avoid triggers if possible.  Shower prior to bedtime if exposed to triggers.  If allergic dust/dust mites recommend mattress/pillow covers/encasements; washing linens, vacuuming, sweeping, dusting weekly.  Call or return to clinic as needed if these symptoms worsen or fail to improve as anticipated.   Exitcare handout on allergic rhinitis and sinus rinse given to patient.  Patient verbalized understanding of instructions, agreed with plan of care and had no further questions at this time.  P2:  Avoidance and hand washing.  Restart flonase 1 spray each nostril BID, saline 2 sprays each nostril q2h wa prn congestion.  If no improvement with 48 hours of saline and flonase use start augmentin 875mg  po BID x 10 days #20 RF0 dispensed from [facility].  Denied personal or family history of ENT cancer.  Shower BID especially prior to bed. No evidence of systemic bacterial infection, non toxic and well hydrated.  I do not see where any further testing or imaging is necessary at this time.   I will suggest  supportive care, rest, good hygiene and encourage the patient to take adequate fluids.  The patient is to return to clinic or EMERGENCY ROOM if symptoms worsen or change significantly.  Exitcare handout on sinusitis and sinus rinse given to patient.  Patient verbalized agreement and understanding of treatment plan and had no further questions at this time.   P2:  Hand washing and cover cough  Usually no specific medical treatment is needed if a virus is causing the sore throat. The throat most often gets better on its own within 5 to 7 days. Antibiotic medicine does not cure viral pharyngitis.  For acute pharyngitis caused by bacteria, your healthcare provider will prescribe an antibiotic.  Marland Kitchen Do not smoke.  Marland Kitchen Avoid secondhand smoke and other air pollutants.  . Use a cool mist humidifier to add moisture to the air.  . Get plenty of rest.  . You may want to rest your throat by talking less and eating a diet that is mostly liquid or soft for a day  or two.  Marland Kitchen Nonprescription throat lozenges and mouthwashes should help relieve the soreness.  . Gargling with warm saltwater and drinking warm liquids may help. (You can make a saltwater solution by adding 1/4 teaspoon of salt to 8 ounces, or 240 mL, of warm water.)  . A nonprescription pain reliever such as aspirin, acetaminophen, or ibuprofen may ease general aches and pains.  FOLLOW UP with clinic provider if no improvements in the next 7-10 days. Exitcare handout on acute pharyngitis given to patient.  Discussed augmentin for otitis media also covers for strep throat.  Rapid strep testing not available in this clinic nor are throat cultures.  Patient verbalized understanding of instructions and agreed with plan of care.   Patient had no further questions at this time P2: Hand washing and diet.

## 2017-08-26 ENCOUNTER — Ambulatory Visit: Payer: Self-pay | Admitting: Registered Nurse

## 2017-08-26 VITALS — BP 135/90 | HR 94 | Temp 97.8°F

## 2017-08-26 DIAGNOSIS — S76211A Strain of adductor muscle, fascia and tendon of right thigh, initial encounter: Secondary | ICD-10-CM

## 2017-08-26 DIAGNOSIS — K4090 Unilateral inguinal hernia, without obstruction or gangrene, not specified as recurrent: Secondary | ICD-10-CM

## 2017-08-26 MED ORDER — ACETAMINOPHEN 500 MG PO TABS: 1000.0000 mg | ORAL_TABLET | Freq: Four times a day (QID) | ORAL | 0 refills | Status: AC | PRN

## 2017-08-26 NOTE — Progress Notes (Signed)
Subjective:    Patient ID: Lucas Barrett, male    DOB: 02/10/1986, 32 y.o.   MRN: [id number]  32 y/o Caucasian established male pt c/o perineal pain x4 days. States he had original injury approx one year ago while working out, doing squats and felt a pain that progressively worsened suprapubic and perineal "I looked up word on internet". Took approx 2 months to improve. Was not seen at that time, self treated. Assumes it was a muscle strain. Reinjured 4 days ago when he slipped on water on floor at home and legs split apart. Pain has progressively worsened since then. Worse in certain positions, especially with legs outstretched. Pain with ambulation, or sitting. Pain does not radiate. Denies loss of bowel or bladder control. Denies dysuria. Has been using ice and heat and Ibuprofen 800mg  q8h at home for relief.  Applying ice maybe twice a day for 10 minutes.  Wears boxers.  Has not noticed any bulges inguinal/scrotal.  Denied dysuria/hematuria/penile discharge.  Stinging perineal pain initially after fall at base of penis.     Review of Systems  Constitutional: Negative for activity change, appetite change, chills, diaphoresis, fatigue and fever.  HENT: Negative for trouble swallowing and voice change.   Eyes: Negative for photophobia and visual disturbance.  Respiratory: Negative for cough, shortness of breath, wheezing and stridor.   Cardiovascular: Negative for leg swelling.  Gastrointestinal: Positive for abdominal pain. Negative for abdominal distention, blood in stool, diarrhea, nausea and vomiting.  Endocrine: Negative for cold intolerance and heat intolerance.  Genitourinary: Positive for penile pain. Negative for decreased urine volume, difficulty urinating, discharge, dysuria, enuresis, flank pain, frequency, genital sores, hematuria, penile swelling, testicular pain and urgency.  Musculoskeletal: Positive for myalgias. Negative for back pain, gait problem, neck pain and neck stiffness.   Skin: Negative for color change, pallor, rash and wound.  Allergic/Immunologic: Positive for environmental allergies. Negative for food allergies.  Neurological: Negative for dizziness, tremors, seizures, syncope, facial asymmetry, speech difficulty, weakness, light-headedness, numbness and headaches.  Hematological: Negative for adenopathy. Does not bruise/bleed easily.  Psychiatric/Behavioral: Negative for agitation, confusion and sleep disturbance.       Objective:   Physical Exam  Constitutional: He is oriented to person, place, and time. Vital signs are normal. He appears well-developed and well-nourished. He is active and cooperative.  Non-toxic appearance. He does not have a sickly appearance. He does not appear ill. No distress.  HENT:  Head: Normocephalic and atraumatic.  Right Ear: Hearing and external ear normal.  Left Ear: Hearing and external ear normal.  Nose: Nose normal.  Mouth/Throat: Uvula is midline, oropharynx is clear and moist and mucous membranes are normal. No oropharyngeal exudate.  Eyes: Pupils are equal, round, and reactive to light. Conjunctivae, EOM and lids are normal.  Neck: Trachea normal, normal range of motion and phonation normal. Neck supple.  Cardiovascular: Normal rate, regular rhythm, normal heart sounds and intact distal pulses.  Pulmonary/Chest: Effort normal and breath sounds normal. No stridor. He has no decreased breath sounds. He has no wheezes. He has no rhonchi. He has no rales.  Abdominal: Soft. Normal appearance. He exhibits no shifting dullness, no distension, no pulsatile liver, no fluid wave, no abdominal bruit, no ascites, no pulsatile midline mass and no mass. Bowel sounds are decreased. There is tenderness in the suprapubic area. There is no rigidity, no rebound, no guarding, no CVA tenderness, no tenderness at McBurney's point and negative Murphy's sign. A hernia is present. Hernia confirmed positive in the  right inguinal area. Hernia  confirmed negative in the ventral area and confirmed negative in the left inguinal area.  Spontaneously resolving at rest right inguinal hernia with cough;   Genitourinary:  Genitourinary Comments: Scrotal/penis exam deferred due to clinic contract restrictions  Musculoskeletal: He exhibits no edema, tenderness or deformity.       Right shoulder: Normal.       Left shoulder: Normal.       Right elbow: Normal.      Left elbow: Normal.       Right hip: He exhibits decreased range of motion.       Left hip: He exhibits decreased range of motion.       Right knee: Normal.       Left knee: Normal.       Cervical back: Normal.       Thoracic back: Normal.       Lumbar back: Normal.       Right hand: Normal.       Left hand: Normal.  Pain with abduction;flexion and extension bilateral hips perineum  Lymphadenopathy: No inguinal adenopathy noted on the right or left side.  Neurological: He is alert and oriented to person, place, and time. He has normal strength. He is not disoriented. He displays no atrophy and no tremor. No cranial nerve deficit or sensory deficit. He exhibits normal muscle tone. He displays no seizure activity. Coordination and gait normal. GCS eye subscore is 4. GCS verbal subscore is 5. GCS motor subscore is 6.  Gait sure and steady in hallway; on/off exam table and in/out of chair without difficulty; supine to sitting and reverse without difficulty  Skin: Skin is warm, dry and intact. Capillary refill takes less than 2 seconds. No abrasion, no bruising, no burn, no ecchymosis, no laceration and no rash noted. He is not diaphoretic. No cyanosis or erythema. No pallor. Nails show no clubbing.  Psychiatric: He has a normal mood and affect. His speech is normal and behavior is normal. Judgment and thought content normal. He is not actively hallucinating. Cognition and memory are normal. He is attentive.  Nursing note and vitals reviewed.         Assessment & Plan:  A-right  inguinal hernia and groin strain initial  P-Discussed signs and symptoms of inguinal hernia  Printed and given exitcare handout on inguinal hernia.  Red flags discussed with patient e.g. Scrotal swelling worsening won't resolve with rest and ice, inguinal bulge won't resolve with rest, hematuria, unable to urinate, nausea/vomiting seek same day re-evaluation PCM/ER.  Patient verbalized understanding information/instructions, agreed with plan of care and had no further questions at this time.  Printed and given rehab exercises on groin strain and [employer].  Tylenol 1000mg  po QID prn pain.  Cryotherapy 15 minutes TID.  Discussed avoid weightlifting and high impact activities until healed 4-6 weeks.  Discussed walking, recumbant bicycle, elliptical use low impact exercises.  Wear compression underwear instead of boxers until healed and when restarting weightlifting.  Avoid holding breath.  Discussed if rehab exercises worsening pain avoid that exercise until further along in healing. Patient verbalized understanding information/instructions, agreed with plan of care and had no further questions at this time.

## 2017-08-26 NOTE — Patient Instructions (Addendum)
Adductor Muscle Strain With Rehab Ask your health care provider which exercises are safe for you. Do exercises exactly as told by your health care provider and adjust them as directed. It is normal to feel mild stretching, pulling, tightness, or discomfort as you do these exercises, but you should stop right away if you feel sudden pain or your pain gets worse.Do not begin these exercises until told by your health care provider. Strengthening exercises These exercises build strength and endurance in your thigh. Endurance is the ability to use your muscles for a long time, even after your muscles get tired. Exercise A: Hip adductor isometrics  1. Sit on a firm chair that positions your knees at about the same height as your hips. 2. Place a large ball, firm pillow, or rolled-up bath towel between your thighs. 3. Squeeze your thighs together, gradually building tension. 4. Hold for ___10_______ seconds. 5. Release the tension gradually. Allow your inner thigh muscles to relax completely before you start the next repetition. Repeat _____3_____ times. Complete this exercise ___2_______ times a day. Exercise B: Straight leg raises ( hip adductors) 1. Lie on your side so your head, shoulder, knee, and hip are in a straight line with each other. To help maintain your balance, you may put the foot of your top leg in front of the leg that is on the floor. Your left / right leg should be on the bottom. 2. Roll your hips slightly forward so your hips are stacked directly over each other and your left / right knee is facing forward. 3. Tense the muscles of your inner thigh and lift your bottom leg 4-6 inches (10-15 cm). 4. Hold this position for ____5-10______ seconds. 5. Slowly lower your leg to the starting position. 6. Allow the muscles to relax completely before you start the next repetition. Repeat ______3____ times. Complete this exercise _____2_____ times a day. Exercise C: Straight leg raises  ( hip extensors) 1. Lie on your belly on a bed or a firm surface with a pillow under your hips. 2. Tense your buttock muscles and lift your left / right thigh off the bed. Your left / right knee can be bent or straight, but do not let your back arch. 3. Hold this position for ______10____ seconds. 4. Slowly return to the starting position. 5. Allow your muscles to relax completely before you start the next repetition. Repeat ___3_______ times. Complete this exercise ___2_______ times a day. Balance exercises These exercises improve or maintain your balance. Balance is important in preventing falls. Exercise D: Single-leg balance 1. Stand near a railing or in a door frame that you can hold onto as needed. 2. Stand on your left / right foot. Keep your big toe down on the floor and try to keep your arch lifted. 3. If this is too easy, you can stand with your eyes closed or stand on a pillow. 4. Hold this position for ___15_______ seconds. Repeat ____3______ times. Complete this exercise _____2_____ times a day. Exercise E: Side lunges 1. Stand with your feet together. 2. Keeping one foot in place, step to the side with your other foot about ____12______ inches (__________ cm). Do not step so far that you feel discomfort. 3. Push off from your stepping foot to return to the starting position. Repeat ___3_______ times on each leg. Complete this exercise _____2_____ times a day. This information is not intended to replace advice given to you by your health care provider. Make sure you discuss any  questions you have with your health care provider. Document Released: 02/25/2005 Document Revised: 10/30/2015 Document Reviewed: 11/29/2014 Elsevier Interactive Patient Education  2018 [employer] Adductor Muscle Strain An adductor muscle strain, also called a groin strain or pull, is an injury to the muscles or tendon on the upper inner part of the thigh. These muscles are called the adductor muscles  or groin muscles. They are responsible for moving the leg across the body or pulling the legs together. A muscle strain occurs when a muscle is overstretched and some muscle fibers are torn. An adductor muscle strain can range from mild to severe, depending on how many muscle fibers are affected and whether the muscle fibers are partially or completely torn. Adductor muscle strains usually occur during exercise or participation in sports. The injury often happens when a sudden, violent force is placed on a muscle, stretching the muscle too far. A strain is more likely to occur when your muscles are not warmed up or if you are not properly conditioned. Depending on the severity of the muscle strain, recovery time may vary from a few weeks to several weeks. Severe injuries often require 4-6 weeks for recovery. In those cases, complete healing can take 4-5 months. What are the causes? This injury may be caused by:  Stretching the adductor muscles too far or too suddenly, often during side-to-side motion with an abrupt change in direction.  Putting repeated stress on the adductor muscles over a long period of time.  Performing vigorous activity without properly stretching the adductor muscles beforehand.  What are the signs or symptoms? Symptoms of this condition include:  Pain and tenderness in the groin area. This begins as sharp pain and persists as a dull ache.  A popping or snapping feeling when the injury occurs (for severe strains).  Swelling or bruising.  Muscle spasms.  Weakness in the leg.  Stiffness in the groin area with decreased ability to move the affected muscles.  How is this diagnosed? This condition may be diagnosed based on your symptoms, your description of how the injury occurred, and a physical exam. X-rays are sometimes needed to rule out a broken bone or cartilage problems. A CT scan or MRI may be done if your health care provider suspects a complete muscle tear or  needs to check for other injuries. How is this treated? An adductor strain will often heal on its own. Typically, treatment will involve protecting the injured area, rest, ice, pressure (compression), and elevation. This is often called PRICE therapy. Your health care provider may also prescribe medicines to help manage pain and swelling (anti-inflammatory medicine). You may be told to use crutches for the first few days to minimize your pain. Follow these instructions at home: PRICE Therapy  Protect the muscle from being injured again.  Rest. Do not use the strained muscle if it causes pain.  If directed, apply ice to the injured area: ? Put ice in a plastic bag. ? Place a towel between your skin and the bag. ? Leave the ice on for 20 minutes, 2-3 times a day. Do this for the first 2 days after the injury.  Apply compression by wrapping the injured area with an elastic bandage as told by your health care provider.  Raise (elevate) the injured area above the level of your heart while you are sitting or lying down. General instructions  Take over-the-counter and prescription medicines only as told by your health care provider.  Walk, stretch, and do exercises  as told by your health care provider. Only do these activities if you can do so without any pain. How is this prevented?  Warm up and stretch before being active.  Cool down and stretch after being active.  Give your body time to rest between periods of activity.  Make sure to use equipment that fits you.  Be safe and responsible while being active to avoid slips and falls.  Maintain physical fitness, including: ? Proper conditioning in the adductor muscles. ? Overall strength, flexibility, and endurance. Contact a health care provider if:  You have increased pain or swelling in the affected area.  Your symptoms are not improving or they are getting worse. This information is not intended to replace advice given to you  by your health care provider. Make sure you discuss any questions you have with your health care provider. Document Released: 10/24/2003 Document Revised: 09/15/2015 Document Reviewed: 11/29/2014 Elsevier Interactive Patient Education  2018 [employer] Inguinal Hernia, Adult An inguinal hernia is when fat or the intestines push through the area where the leg meets the lower abdomen (groin) and create a rounded lump (bulge). This condition develops over time. There are three types of inguinal hernias. These types include:  Hernias that can be pushed back into the belly (are reducible).  Hernias that are not reducible (are incarcerated).  Hernias that are not reducible and lose their blood supply (are strangulated). This type of hernia requires emergency surgery.  What are the causes? This condition is caused by having a weak spot in the muscles or tissue. This weakness lets the hernia poke through. This condition can be triggered by:  Suddenly straining the muscles of the lower abdomen.  Lifting heavy objects.  Straining to have a bowel movement. Difficult bowel movements (constipation) can lead to this.  Coughing.  What increases the risk? This condition is more likely to develop in:  Men.  Pregnant women.  People who: ? Are overweight. ? Work in jobs that require long periods of standing or heavy lifting. ? Have had an inguinal hernia before. ? Smoke or have lung disease. These factors can lead to long-lasting (chronic) coughing.  What are the signs or symptoms? Symptoms can depend on the size of the hernia. Often, a small inguinal hernia has no symptoms. Symptoms of a larger hernia include:  A lump in the groin. This is easier to see when the person is standing. It might not be visible when he or she is lying down.  Pain or burning in the groin. This occurs especially when lifting, straining, or coughing.  A dull ache or a feeling of pressure in the groin.  A lump in  the scrotum in men.  Symptoms of a strangulated inguinal hernia can include:  A bulge in the groin that is very painful and tender to the touch.  A bulge that turns red or purple.  Fever, nausea, and vomiting.  The inability to have a bowel movement or to pass gas.  How is this diagnosed? This condition is diagnosed with a medical history and physical exam. Your health care provider may feel your groin area and ask you to cough. How is this treated? Treatment for this condition varies depending on the size of your hernia and whether you have symptoms. If you do not have symptoms, your health care provider may have you watch your hernia carefully and come in for follow-up visits. If your hernia is larger or if you have symptoms, your treatment will  include surgery. Follow these instructions at home: Lifestyle  Drink enough fluid to keep your urine clear or pale yellow.  Eat a diet that includes a lot of fiber. Eat plenty of fruits, vegetables, and whole grains. Talk with your health care provider if you have questions.  Avoid lifting heavy objects.  Avoid standing for long periods of time.  Do not use tobacco products, including cigarettes, chewing tobacco, or e-cigarettes. If you need help quitting, ask your health care provider.  Maintain a healthy weight. General instructions  Do not try to force the hernia back in.  Watch your hernia for any changes in color or size. Let your health care provider know if any changes occur.  Take over-the-counter and prescription medicines only as told by your health care provider.  Keep all follow-up visits as told by your health care provider. This is important. Contact a health care provider if:  You have a fever.  You have new symptoms.  Your symptoms get worse. Get help right away if:  You have pain in the groin that suddenly gets worse.  A bulge in the groin gets bigger suddenly and does not go down.  You are a man and you  have a sudden pain in the scrotum, or the size of your scrotum suddenly changes.  A bulge in the groin area becomes red or purple and is painful to the touch.  You have nausea or vomiting that does not go away.  You feel your heart beating a lot more quickly than normal.  You cannot have a bowel movement or pass gas. This information is not intended to replace advice given to you by your health care provider. Make sure you discuss any questions you have with your health care provider. Document Released: 07/14/2008 Document Revised: 08/03/2015 Document Reviewed: 01/05/2014 Elsevier Interactive Patient Education  2018 [employer].

## 2017-08-28 ENCOUNTER — Ambulatory Visit: Payer: Self-pay | Admitting: Family Medicine

## 2017-09-01 ENCOUNTER — Ambulatory Visit: Payer: Self-pay | Admitting: Registered Nurse

## 2017-09-01 VITALS — BP 126/81 | HR 91 | Temp 97.4°F

## 2017-09-01 DIAGNOSIS — N50811 Right testicular pain: Secondary | ICD-10-CM

## 2017-09-01 DIAGNOSIS — R1031 Right lower quadrant pain: Secondary | ICD-10-CM

## 2017-09-01 NOTE — Progress Notes (Signed)
31y/o Caucasian established male pt presenting for f/u of inguinal hernia and groin strain. Pain has continued. Worse with exertion, such as push mowing the lawn. Can radiate up into abdomen. Has been taking Ibuprofen prn but is not maintaining pain control for more than approx 2-3 hours. Saw [facility] on 6/21 after being recommended to them by another office he called. Ortho told him they would see him on the phone and got him in the same day but once there they realized they could not help him and refunded back his copay and suggested he see a general surgeon. Pt requesting a general surgeon referral.    Subjective:    Patient ID: Lucas Barrett, male    DOB: 08/27/1985, 32 y.o.   MRN: [id number]  HPI  32 yo male in non acute distress.  Patient returns today rates pain 6/10 when having pain, Ibuprofen is helping. But once medication has worn off he needs to take it again for the pain.Hhe has pain that radiates from his groin into his right testicle.   Review of Systems  Constitutional: Negative for chills and fever.  Genitourinary: Positive for testicular pain. Negative for discharge, penile pain, penile swelling, scrotal swelling and urgency (difficult to start urine  stream at times.).       Objective:   Physical Exam  Constitutional: He appears well-developed and well-nourished.  HENT:  Head: Normocephalic.  Eyes: Pupils are equal, round, and reactive to light. Conjunctivae and EOM are normal.  Genitourinary: Penis normal.  Genitourinary Comments: No pain on palpation of each testicle , both normal feeling in size. 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 No hernia felt when patient coughed. Noted slight bulge suprapubic on right side compared to left side. Unccircumsised male.    Gait wnl , steady.     Assessment & Plan:  Right groin pain radiating to right testicle U/S ordered pelvic limited , continue  Ibuprofen as previously discussed.  Ice to the area. No exertional  ( pushing , pulling), or lifting. Continue to wear briefs for comfort.  Seek out emergency care or urgent care at the Emergency Department or an Urgent Care  if severe pain occurs, change in testicle size, vomiting , fever or any other concerns. Patient verbalizes understanding and has no questions at discharge. Will call patient once I get the results of U/S. .Marland Kitchen

## 2017-09-03 ENCOUNTER — Ambulatory Visit (HOSPITAL_COMMUNITY)
Admission: RE | Admit: 2017-09-03 | Discharge: 2017-09-03 | Disposition: A | Discharge status: Home / Self Care | Payer: PRIVATE HEALTH INSURANCE | Source: Ambulatory Visit | Attending: Medical | Admitting: Medical

## 2017-09-03 DIAGNOSIS — R1031 Right lower quadrant pain: Secondary | ICD-10-CM | POA: Diagnosis not present

## 2017-09-03 DIAGNOSIS — N50811 Right testicular pain: Secondary | ICD-10-CM | POA: Insufficient documentation

## 2017-09-04 ENCOUNTER — Telehealth: Payer: Self-pay | Admitting: Medical

## 2017-09-04 NOTE — Telephone Encounter (Signed)
Called patient about his Ultrasound as listed below.  CLINICAL DATA:  Right groin and perineal pain.  EXAM: ULTRASOUND OF THE MALE PELVIS  COMPARISON:  None.  FINDINGS: Ultrasound demonstrates no evidence of inguinal hernia, mass, cyst, or muscle tear. No abnormal fluid collections or hematomas.  IMPRESSION: Negative ultrasound of the right inguinal area and perineum.  He was not happy that no diagnosis was found and he spent  300 dollars.  I discussed that this pain he feels may be from his torn abductor muscle previously diagnosed. He is concerned he still has a knot in his grown and feels the pain is deep in the groin.  Will discuss with Albina Billetina Betancourt NP about the next step for him.   He stated he is not going to spend any more money on tests since he did not meet his deductible this year.  As of note he did see an orthopedic doctor who then realized patient  was being seen for right inguinal hernia, and referred patient onto general surgeon for continued care through the patients work clinic.

## 2017-09-05 ENCOUNTER — Telehealth: Payer: Self-pay | Admitting: Medical

## 2017-09-05 NOTE — Telephone Encounter (Signed)
Left message as it identified him on his phone. Reviewed results with Albina Billetina Betancourt NP, she would like to see how he does in follow up. Left message for him to follow up in 2 weeks for recheck. If he has questions to call the clinic.

## 2017-09-30 ENCOUNTER — Encounter: Payer: Self-pay | Admitting: Registered Nurse

## 2017-09-30 ENCOUNTER — Ambulatory Visit: Payer: Self-pay | Admitting: Registered Nurse

## 2017-09-30 VITALS — BP 119/77 | HR 86 | Temp 97.5°F | Resp 18

## 2017-09-30 DIAGNOSIS — S76211D Strain of adductor muscle, fascia and tendon of right thigh, subsequent encounter: Secondary | ICD-10-CM

## 2017-09-30 MED ORDER — PREDNISONE 10 MG (21) PO TBPK: ORAL_TABLET | ORAL | 0 refills | Status: DC

## 2017-09-30 NOTE — Patient Instructions (Signed)
Adductor Muscle Strain An adductor muscle strain, also called a groin strain or pull, is an injury to the muscles or tendon on the upper inner part of the thigh. These muscles are called the adductor muscles or groin muscles. They are responsible for moving the leg across the body or pulling the legs together. A muscle strain occurs when a muscle is overstretched and some muscle fibers are torn. An adductor muscle strain can range from mild to severe, depending on how many muscle fibers are affected and whether the muscle fibers are partially or completely torn. Adductor muscle strains usually occur during exercise or participation in sports. The injury often happens when a sudden, violent force is placed on a muscle, stretching the muscle too far. A strain is more likely to occur when your muscles are not warmed up or if you are not properly conditioned. Depending on the severity of the muscle strain, recovery time may vary from a few weeks to several weeks. Severe injuries often require 4-6 weeks for recovery. In those cases, complete healing can take 4-5 months. What are the causes? This injury may be caused by:  Stretching the adductor muscles too far or too suddenly, often during side-to-side motion with an abrupt change in direction.  Putting repeated stress on the adductor muscles over a long period of time.  Performing vigorous activity without properly stretching the adductor muscles beforehand.  What are the signs or symptoms? Symptoms of this condition include:  Pain and tenderness in the groin area. This begins as sharp pain and persists as a dull ache.  A popping or snapping feeling when the injury occurs (for severe strains).  Swelling or bruising.  Muscle spasms.  Weakness in the leg.  Stiffness in the groin area with decreased ability to move the affected muscles.  How is this diagnosed? This condition may be diagnosed based on your symptoms, your description of how the  injury occurred, and a physical exam. X-rays are sometimes needed to rule out a broken bone or cartilage problems. A CT scan or MRI may be done if your health care provider suspects a complete muscle tear or needs to check for other injuries. How is this treated? An adductor strain will often heal on its own. Typically, treatment will involve protecting the injured area, rest, ice, pressure (compression), and elevation. This is often called PRICE therapy. Your health care provider may also prescribe medicines to help manage pain and swelling (anti-inflammatory medicine). You may be told to use crutches for the first few days to minimize your pain. Follow these instructions at home: PRICE Therapy  Protect the muscle from being injured again.  Rest. Do not use the strained muscle if it causes pain.  If directed, apply ice to the injured area: ? Put ice in a plastic bag. ? Place a towel between your skin and the bag. ? Leave the ice on for 20 minutes, 2-3 times a day. Do this for the first 2 days after the injury.  Apply compression by wrapping the injured area with an elastic bandage as told by your health care provider.  Raise (elevate) the injured area above the level of your heart while you are sitting or lying down. General instructions  Take over-the-counter and prescription medicines only as told by your health care provider.  Walk, stretch, and do exercises as told by your health care provider. Only do these activities if you can do so without any pain. How is this prevented?  Warm up   and stretch before being active.  Cool down and stretch after being active.  Give your body time to rest between periods of activity.  Make sure to use equipment that fits you.  Be safe and responsible while being active to avoid slips and falls.  Maintain physical fitness, including: ? Proper conditioning in the adductor muscles. ? Overall strength, flexibility, and endurance. Contact a  health care provider if:  You have increased pain or swelling in the affected area.  Your symptoms are not improving or they are getting worse. This information is not intended to replace advice given to you by your health care provider. Make sure you discuss any questions you have with your health care provider. Document Released: 10/24/2003 Document Revised: 09/15/2015 Document Reviewed: 11/29/2014 Elsevier Interactive Patient Education  2018 [employer]  

## 2017-09-30 NOTE — Progress Notes (Signed)
Subjective:    Patient ID: Lucas Barrett, male    DOB: 1985/08/02, 32 y.o.   MRN: [id number]  HPI  32 y/o Caucasian established male pt c/o perineal pain started 4 days prior to OV 08/26/2017 last seen by PA Ratcliffe and had pelvic US limited r/o hernia/tear normal/negative 09/03/2017.  Has been only riding lawnmower no push/weed wacking.  Hasn't returned to weightlifting at gym.  Spoke with friend who recommended steroids taper sometimes helps groin strain.  Here today to see if I will prescribed for him.  States he had original injury approx one year ago while working out, doing squats and felt a pain that progressively worsened suprapubic and perineal "I looked up word on internet". Took approx 2 months to improve. Was not seen at that time, self treated. Assumes it was a muscle strain. Reinjured June 2018 when he slipped on water on floor at home and legs split apart. Pain has progressively worsened since then. Worse in certain positions, especially with legs outstretched. Pain with ambulation, or sitting. Pain does not radiate. Denies loss of bowel or bladder control. Some difficulty starting stream up to 30 seconds at times but otherwise denied dysuria/burning/hematuria/penile discharge.  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  Stopped icing.  Wears boxers.  Has not noticed any bulges inguinal/scrotal.  Stinging perineal pain initially after fall at base of penis.   Review of Systems Constitutional: Negative for activity change, appetite change, chills, diaphoresis, fatigue and fever.  HENT: Negative for trouble swallowing and voice change.   Eyes: Negative for photophobia and visual disturbance.  Respiratory: Negative for cough, shortness of breath, wheezing and stridor.   Cardiovascular: Negative for leg swelling.  Gastrointestinal: Positive for abdominal  pain. Negative for abdominal distention, blood in stool, diarrhea, nausea and vomiting.  Endocrine: Negative for cold intolerance and heat intolerance.  Genitourinary: Positive for penile pain. Negative for decreased urine volume, difficulty urinating, discharge, dysuria, enuresis, flank pain, frequency, genital sores, hematuria, penile swelling, testicular pain and urgency.  Musculoskeletal: Positive for myalgias. Negative for back pain, gait problem, neck pain and neck stiffness.  Skin: Negative for color change, pallor, rash and wound.  Allergic/Immunologic: Positive for environmental allergies. Negative for food allergies.  Neurological: Negative for dizziness, tremors, seizures, syncope, facial asymmetry, speech difficulty, weakness, light-headedness, numbness and headaches.  Hematological: Negative for adenopathy. Does not bruise/bleed easily.  Psychiatric/Behavioral: Negative for agitation, confusion and sleep disturbance.    Objective:   Physical Exam Constitutional: He is oriented to person, place, and time. Vital signs are normal. He appears well-developed and well-nourished. He is active and cooperative.  Non-toxic appearance. He does not have a sickly appearance. He does not appear ill. No distress.  HENT:  Head: Normocephalic and atraumatic.  Right Ear: Hearing and external ear normal.  Left Ear: Hearing and external ear normal.  Nose: Nose normal.  Mouth/Throat: Uvula is midline, oropharynx is clear and moist and mucous membranes are normal. No oropharyngeal exudate.  Eyes: Pupils are equal, round, and reactive to light. Conjunctivae, EOM and lids are normal.  Neck: Trachea normal, normal range of motion and phonation normal. Neck supple.  Cardiovascular: Normal rate, regular rhythm, normal heart sounds and intact distal pulses.  Pulmonary/Chest: Effort normal and breath sounds normal. No stridor. He has no decreased breath sounds. He has no wheezes. He has no rhonchi. He has no  rales.  Abdominal: Soft. Normal appearance. He exhibits no shifting dullness, no distension, no pulsatile liver, no fluid wave, no abdominal bruit, no ascites, no pulsatile midline mass and no mass. Bowel sounds are decreased.  There is no tenderness, no bulge with abdominal crunch ventral or inguinal, no rigidity, no rebound, no guarding, no CVA tenderness, no tenderness at McBurney's point and negative Murphy's sign.  Genitourinary:  Genitourinary Comments: Scrotal/penis exam deferred due to clinic contract restrictions  Musculoskeletal: He exhibits no edema, tenderness or deformity.       Right shoulder: Normal.       Left shoulder: Normal.       Right elbow: Normal.      Left elbow: Normal.       Right hip: Normal       Left hip: Normal      Right knee: Normal.       Left knee: Normal.       Cervical back: Normal.       Thoracic back: Normal.       Lumbar back: Normal.       Right hand: Normal.       Left hand: Normal.  Pain with duck squat and slight pulling and abduction and adduction bilateral hips; no pain with hip flexion and extension  Lymphadenopathy: No inguinal adenopathy noted on the right or left side.  Neurological: He is alert and oriented to person, place, and time. He has normal strength. He is not disoriented. He displays no atrophy and no tremor. No cranial nerve deficit or sensory deficit. He exhibits normal muscle tone. He displays no seizure activity. Coordination and gait normal. GCS eye subscore is 4. GCS verbal subscore is 5. GCS motor subscore is 6.  Gait sure and steady in hallway; on/off exam table and in/out of chair without difficulty; supine to sitting and reverse without difficulty  Skin: Skin is warm, dry and intact. Capillary refill takes less than 2 seconds. No abrasion, no bruising, no burn, no ecchymosis, no laceration and no rash noted. He is not diaphoretic. No cyanosis or erythema. No pallor. Nails show no clubbing.  Psychiatric: He has a normal mood  and affect. His speech is normal and behavior is normal. Judgment and thought content normal. He is not actively hallucinating. Cognition and memory are normal. He is attentive.  Nursing note and vitals reviewed.     Assessment & Plan:  Ice compression low impact weight loss trial prednisone taper discussed posisble side effects follow up if new or worsening symptoms  A-right groin strain subsequent visit  P-Discussed signs and symptoms of inguinal hernia Red flags discussed with patient e.g. Scrotal swelling worsening won't resolve with rest and ice, inguinal bulge won't resolve with rest, hematuria, unable to urinate, nausea/vomiting seek same day re-evaluation PCM/ER.  Patient verbalized understanding information/instructions, agreed with plan of care and had no further questions at this time.  Range of motion has improved along with decreased pain 3/10 at rest and 8/10 with squatting only.  US pelvis limited negative for mass/fluid collection/hernia 09/03/2017.  Patient would like to complete prednisone taper trial.  Reviewed occupational medicine and orthopedic society guidelines up to date with patient for groin strain and hernia.  Steroids are not front line but typically not harmful may help inflammation reduction.  Take with breakfast/food otherwise later in day/night may cause insomnia.  Discussed possible side effects increased blood pressure, heart rate, increase/decrease appetite.  Dispensed 10mg  prednisone taper from [facility] to patient #21 RF0  start 60mg /50/40/30/20/10.  Consider urology evaluation if worsening unable to start stream or perineal pain.  Restart rehab exercises on groin strain previously [facility] handout given.  Tylenol 1000mg  po QID prn pain.  Cryotherapy 15 minutes TID prn pain/swelling.  Discussed avoid weightlifting and high impact activities until healed 4-6 weeks.  Discussed walking, recumbant bicycle, elliptical use low impact exercises.  Consider compression underwear  instead of boxers when restarting high impact or increasing weight lifting.  Avoid holding breath.  Discussed if rehab exercises worsening pain avoid that exercise until further along in healing. Patient verbalized understanding information/instructions, agreed with plan of care and had no further questions at this time

## 2018-06-27 ENCOUNTER — Ambulatory Visit (HOSPITAL_COMMUNITY)
Admission: EM | Admit: 2018-06-27 | Discharge: 2018-06-27 | Disposition: A | Discharge status: Home / Self Care | Payer: PRIVATE HEALTH INSURANCE | Attending: Family Medicine | Admitting: Family Medicine

## 2018-06-27 ENCOUNTER — Encounter (HOSPITAL_COMMUNITY): Payer: Self-pay | Admitting: Emergency Medicine

## 2018-06-27 ENCOUNTER — Other Ambulatory Visit: Payer: Self-pay

## 2018-06-27 DIAGNOSIS — K2100 Gastro-esophageal reflux disease with esophagitis, without bleeding: Secondary | ICD-10-CM

## 2018-06-27 DIAGNOSIS — K2990 Gastroduodenitis, unspecified, without bleeding: Secondary | ICD-10-CM | POA: Diagnosis not present

## 2018-06-27 DIAGNOSIS — K2970 Gastritis, unspecified, without bleeding: Secondary | ICD-10-CM

## 2018-06-27 MED ORDER — ALUM & MAG HYDROXIDE-SIMETH 200-200-20 MG/5ML PO SUSP
ORAL | Status: AC
  Filled 2018-06-27: qty 30

## 2018-06-27 MED ORDER — SUCRALFATE 1 GM/10ML PO SUSP: 1.0000 g | Freq: Three times a day (TID) | ORAL | 0 refills | Status: DC

## 2018-06-27 MED ORDER — LIDOCAINE VISCOUS HCL 2 % MT SOLN
15.0000 mL | Freq: Once | OROMUCOSAL | Status: AC
  Administered 2018-06-27: 15 mL via ORAL

## 2018-06-27 MED ORDER — ALUM & MAG HYDROXIDE-SIMETH 200-200-20 MG/5ML PO SUSP
30.0000 mL | Freq: Once | ORAL | Status: AC
  Administered 2018-06-27: 30 mL via ORAL

## 2018-06-27 MED ORDER — OMEPRAZOLE 20 MG PO CPDR: 20.0000 mg | DELAYED_RELEASE_CAPSULE | Freq: Every day | ORAL | 1 refills | Status: DC

## 2018-06-27 MED ORDER — LIDOCAINE VISCOUS HCL 2 % MT SOLN
OROMUCOSAL | Status: AC
Start: 2018-06-27 — End: ?
  Filled 2018-06-27: qty 15

## 2018-06-27 NOTE — ED Triage Notes (Signed)
Per pt he started having upper gastric pain with burning and bloating that started last night with vomiting and nausea. Was taking Tums with no relief. No chest pain no sob, no diarrhea.

## 2018-06-27 NOTE — Discharge Instructions (Addendum)
Please return if symptoms have not subsided in 24 hours.

## 2018-06-27 NOTE — ED Provider Notes (Signed)
[facility]    CSN: [id number] Arrival date & time: 06/27/18  1134     History   Chief Complaint Chief Complaint  Patient presents with  . Abdominal Pain    HPI Lucas Barrett is a 33 y.o. male.   This is the initial [facility] visit for this 33 yo man who presents today complaining of "stomach pain."  Patient started having upper gastric pain with burning and bloating that started last night with vomiting and nausea. Was taking Tums with no relief. No chest pain no sob, no diarrhea.  No alcohol consumption.  No h/o similar sx.  He is married and works at [employer].  His wife has no symptoms.  Patient has had two episodes of severe nausea and watery emesis over night.  Marked epigastric burning.  Some belching.  He took a Pepcid last night as well.     Past Medical History:  Diagnosis Date  . Anxiety   . Hypertension     Patient Active Problem List   Diagnosis Date Noted  . Panic attacks 06/02/2014    No past surgical history on file.     Home Medications    Prior to Admission medications   Medication Sig Start Date End Date Taking? Authorizing Provider  cetirizine (ZYRTEC) 10 MG tablet Take 10 mg daily by mouth.    [provider]  clonazePAM (KLONOPIN) 1 MG tablet Take one tablet my mouth once daily as needed for panic attacks. Patient taking differently: Take 1 mg by mouth daily as needed for anxiety. Take one tablet my mouth once daily as needed for panic attacks. 06/02/14   Doreene Nest, NP  fluticasone (FLONASE) 50 MCG/ACT nasal spray Place 1 spray 2 (two) times daily into both nostrils. 01/21/17 09/01/17  Betancourt, Jarold Song, NP  omeprazole (PRILOSEC) 20 MG capsule Take 1 capsule (20 mg total) by mouth daily. 06/27/18   Elvina Sidle, MD  sodium chloride (OCEAN) 0.65 % SOLN nasal spray Place 2 sprays every 2 (two) hours while awake into both nostrils. 01/21/17 09/01/17  Betancourt, Jarold Song, NP  sucralfate (CARAFATE) 1 GM/10ML  suspension Take 10 mLs (1 g total) by mouth 4 (four) times daily -  with meals and at bedtime. 06/27/18   Elvina Sidle, MD    Family History Family History  Problem Relation Age of Onset  . Hypertension Mother     Social History Social History   Tobacco Use  . Smoking status: Never Smoker  Substance Use Topics  . Alcohol use: No    Alcohol/week: 0.0 standard drinks  . Drug use: No     Allergies   Patient has no known allergies.   Review of Systems Review of Systems  Constitutional: Positive for fatigue.  HENT: Negative.   Respiratory: Negative.   Gastrointestinal: Positive for abdominal pain, nausea and vomiting.     Physical Exam Triage Vital Signs ED Triage Vitals  Enc Vitals Group     BP      Pulse      Resp      Temp      Temp src      SpO2      Weight      Height      Head Circumference      Peak Flow      Pain Score      Pain Loc      Pain Edu?      Excl. in GC?  No data found.  Updated Vital Signs BP 134/87 (BP Location: Right Arm)   Pulse (!) 110   Temp 98.1 F (36.7 C) (Oral)   Resp 16   SpO2 100%    Physical Exam Vitals signs and nursing note reviewed.  Constitutional:      Appearance: He is well-developed and normal weight.  HENT:     Head: Normocephalic.     Mouth/Throat:     Mouth: Mucous membranes are moist.  Cardiovascular:     Rate and Rhythm: Tachycardia present.     Heart sounds: Normal heart sounds.  Pulmonary:     Effort: Pulmonary effort is normal.     Breath sounds: Normal breath sounds.  Abdominal:     General: Abdomen is flat. Bowel sounds are normal.     Palpations: Abdomen is soft.     Tenderness: There is abdominal tenderness in the epigastric area.  Skin:    General: Skin is warm and dry.  Neurological:     General: No focal deficit present.     Mental Status: He is alert.  Psychiatric:        Mood and Affect: Mood normal.        Behavior: Behavior normal.   Minimal improvement with GI cocktail    UC Treatments / Results  Labs (all labs ordered are listed, but only abnormal results are displayed) Labs Reviewed - No data to display  EKG None  Radiology No results found.  Procedures Procedures (including critical care time)  Medications Ordered in UC Medications  alum & mag hydroxide-simeth (MAALOX/MYLANTA) 200-200-20 MG/5ML suspension 30 mL (30 mLs Oral Given 06/27/18 1203)    And  lidocaine (XYLOCAINE) 2 % viscous mouth solution 15 mL (15 mLs Oral Given 06/27/18 1203)    Initial Impression / Assessment and Plan / UC Course  I have reviewed the triage vital signs and the nursing notes.  Pertinent labs & imaging results that were available during my care of the patient were reviewed by me and considered in my medical decision making (see chart for details).    Final Clinical Impressions(s) / UC Diagnoses   Final diagnoses:  Gastritis and gastroduodenitis  Gastroesophageal reflux disease with esophagitis     Discharge Instructions     Please return if symptoms have not subsided in 24 hours.    ED Prescriptions    Medication Sig Dispense Auth. Provider   sucralfate (CARAFATE) 1 GM/10ML suspension Take 10 mLs (1 g total) by mouth 4 (four) times daily -  with meals and at bedtime. 420 mL Elvina SidleLauenstein, Abednego Yeates, MD   omeprazole (PRILOSEC) 20 MG capsule Take 1 capsule (20 mg total) by mouth daily. 10 capsule Elvina SidleLauenstein, Eduar Kumpf, MD     Controlled Substance Prescriptions [place] Controlled Substance Registry consulted? Not Applicable   Elvina SidleLauenstein, Tedric Leeth, MD 06/27/18 1222

## 2018-07-10 ENCOUNTER — Other Ambulatory Visit: Payer: Self-pay

## 2018-07-10 ENCOUNTER — Ambulatory Visit: Payer: Self-pay | Admitting: *Deleted

## 2018-07-10 VITALS — BP 133/89 | HR 110 | Temp 97.4°F

## 2018-07-10 DIAGNOSIS — R42 Dizziness and giddiness: Secondary | ICD-10-CM

## 2018-07-10 MED ORDER — OMEPRAZOLE 20 MG PO CPDR: 20.0000 mg | DELAYED_RELEASE_CAPSULE | Freq: Every day | ORAL | 0 refills | Status: DC

## 2018-07-10 MED ORDER — SUCRALFATE 1 GM/10ML PO SUSP: 1.0000 g | Freq: Three times a day (TID) | ORAL | 0 refills | Status: DC

## 2018-07-10 NOTE — Progress Notes (Signed)
Labs drawn L AC. Pt had developed lightheadedness, weakness, tunnel vision, tingling sensation during lab draw. This is normal for him but he sts it is a bit more severe than usual since he was already lightheaded to start with. Pt ambulatory to exam bed in office to lie down. Sx started getting better after lying flat. Pulse dropped to 80 after walking and position changes. Within approx 2 min, had increased to 90 and remained stable. He laid flat for approx 10 min and sx resolved. Had him sit with legs off side of bed for 2 minutes, then to standing when he felt better. Back to his baseline and left clinic ambulatory. Reviewed ER precautions again. No further questions. Appt made for NP clinic Tuesday for f/u.

## 2018-07-10 NOTE — Progress Notes (Signed)
Pt c/o lightheadedness, dizziness x2 weeks. Was seen at [facility] on 4/18. Dx with gastritis. Sent home with Sucralfate, Omeprazole. His biggest concern at that time was 2 days of n/v. Lightheadedness was present then but assumed r/t the gastritis. He has not had any vomiting since then. Appetite normal for him, adequate fluid intake, but mild nausea after eating or drinking anything. Intermittent bloated feeling and reflux still. Out of sucralfate and omeprazole. Taking Tums prn. However lightheadedness has become his major sx. He reports at least once a day, sometimes multiple times a day, after eating or drinking anything, like coffee this morning, he becomes dizzy, disoriented per his own words, hands and feet very cold and tingling. Currently warm to the touch face, upper arms, but hands cool, clammy. No fever in clinic. Appears slightly pale. He sts these sx have been going on 30-40 minutes now. He has laid down for a few minutes to help the presyncope feeling pass. Some activity provokes these sx as well, such as yardwork after just a few minutes yesterday. But after resolved, he felt fine and was able to continue the rest of the evening doing house work with no issues.  Pt denies known sick contacts, including spouse. Denies etoh intake. Denies skin changes/rashes. Denies changes in stools.   Case discussed with NP Betancourt over phone. Labs ordered. Refilled Sucralfate and will dispense Omeprazole from [facility] clinic stock.

## 2018-07-11 ENCOUNTER — Encounter: Payer: Self-pay | Admitting: Registered Nurse

## 2018-07-11 ENCOUNTER — Telehealth: Payer: Self-pay | Admitting: Registered Nurse

## 2018-07-11 LAB — CMP12+LP+TP+TSH+6AC+CBC/D/PLT
ALT: 56 IU/L — ABNORMAL HIGH (ref 0–44)
AST: 29 IU/L (ref 0–40)
Albumin/Globulin Ratio: 2.1 (ref 1.2–2.2)
Albumin: 5.1 g/dL — ABNORMAL HIGH (ref 4.0–5.0)
Alkaline Phosphatase: 83 IU/L (ref 39–117)
BUN/Creatinine Ratio: 11 (ref 9–20)
BUN: 16 mg/dL (ref 6–20)
Basophils Absolute: 0 10*3/uL (ref 0.0–0.2)
Basos: 0 %
Bilirubin Total: 0.4 mg/dL (ref 0.0–1.2)
Calcium: 10.4 mg/dL — ABNORMAL HIGH (ref 8.7–10.2)
Chloride: 100 mmol/L (ref 96–106)
Chol/HDL Ratio: 5.9 ratio — ABNORMAL HIGH (ref 0.0–5.0)
Cholesterol, Total: 270 mg/dL — ABNORMAL HIGH (ref 100–199)
Creatinine, Ser: 1.42 mg/dL — ABNORMAL HIGH (ref 0.76–1.27)
EOS (ABSOLUTE): 0 10*3/uL (ref 0.0–0.4)
Eos: 0 %
Estimated CHD Risk: 1.2 times avg. — ABNORMAL HIGH (ref 0.0–1.0)
Free Thyroxine Index: 2.4 (ref 1.2–4.9)
GFR calc Af Amer: 74 mL/min/{1.73_m2} (ref >59)
GFR calc non Af Amer: 64 mL/min/{1.73_m2} (ref >59)
GGT: 73 IU/L — ABNORMAL HIGH (ref 0–65)
Globulin, Total: 2.4 g/dL (ref 1.5–4.5)
Glucose: 99 mg/dL (ref 65–99)
HDL: 46 mg/dL (ref >39)
Hematocrit: 50.6 % (ref 37.5–51.0)
Hemoglobin: 17.1 g/dL (ref 13.0–17.7)
Immature Grans (Abs): 0 10*3/uL (ref 0.0–0.1)
Immature Granulocytes: 0 %
Iron: 81 ug/dL (ref 38–169)
LDH: 164 IU/L (ref 121–224)
LDL Calculated: 210 mg/dL — ABNORMAL HIGH (ref 0–99)
Lymphocytes Absolute: 1.7 10*3/uL (ref 0.7–3.1)
Lymphs: 17 %
MCH: 28.9 pg (ref 26.6–33.0)
MCHC: 33.8 g/dL (ref 31.5–35.7)
MCV: 86 fL (ref 79–97)
Monocytes Absolute: 0.7 10*3/uL (ref 0.1–0.9)
Monocytes: 7 %
Neutrophils Absolute: 7.2 10*3/uL — ABNORMAL HIGH (ref 1.4–7.0)
Neutrophils: 76 %
Phosphorus: 2.5 mg/dL — ABNORMAL LOW (ref 2.8–4.1)
Platelets: 306 10*3/uL (ref 150–450)
Potassium: 4.5 mmol/L (ref 3.5–5.2)
RBC: 5.92 x10E6/uL — ABNORMAL HIGH (ref 4.14–5.80)
RDW: 13.5 % (ref 11.6–15.4)
Sodium: 140 mmol/L (ref 134–144)
T3 Uptake Ratio: 30 % (ref 24–39)
T4, Total: 8 ug/dL (ref 4.5–12.0)
TSH: 1.46 u[IU]/mL (ref 0.450–4.500)
Total Protein: 7.5 g/dL (ref 6.0–8.5)
Triglycerides: 72 mg/dL (ref 0–149)
Uric Acid: 7.5 mg/dL (ref 3.7–8.6)
VLDL Cholesterol Cal: 14 mg/dL (ref 5–40)
WBC: 9.7 10*3/uL (ref 3.4–10.8)

## 2018-07-11 LAB — LIPASE: Lipase: 32 U/L (ref 13–78)

## 2018-07-11 LAB — AMYLASE: Amylase: 63 U/L (ref 31–110)

## 2018-07-11 NOTE — Telephone Encounter (Signed)
Contacted patient to re-evaluate via telephone and discuss lab results drawn yesterday.  Late entry RN Rolly Salter contacted me yesterday via telephone to discuss patient condition at work see RN note.  Discussed with RN Rolly Salter to review with patient ER precautions e.g. worsening abdomen pain, syncope, palpitations, tarry/bright red or coffee ground stools/emesis seek re-evaluation same day.  His symptoms may be viral gastroenteritis, atypical COVID-19 sx, stress related gastric ulcer or gastritis, GERD related gastritis, PUD, iron elevated disorder.  Unable to currently test for covid-19 unless being admitted/unstable/unconscious patient and patient currently does not meet these criteria.  Ordered nonfasting exec panel male plus amylase and lipase as patient with elevated LFTs, creatinine, cholesterol, platelets, glucose fasting, and iron and slightly low GFR in 2018.  Refilled omeprazole DR 20mg  po daily #90 RF0 dispensed from [facility] to patient and sucralfate 1gm/7ml po QID with meals and at bedtime electronic Rx to his pharmacy of choice.  Recommended to avoid NSAIDS due to altered renal function and tylenol 1000mg  po QID prn pain.  Avoid dehydration and OTC medications without first consulting a provider or pharmacist.  Attempted to speak with patient yesterday via telephone but he did not answer and message left for him to contact RN Rolly Salter as she was trying to draw nonfasting labs I had ordered.  RN Rolly Salter verbalized understanding of information/instructions and had no further questions at that time.  Patient this am verified he has been taking tums regularly multiple times per day mainly for stomach bloating.  Last alcohol intake longer than 6 months ago.  Patient reported he has been following keto diet since October 2019 and has lost 30 lbs intentionally.  He had been out of work for 2 weeks and only recently returned due to covid 19 reduced hour schedule.  He is trying to sell his house and has been stressed due  to all of these things occurring at one time.  He restarted omeprazole yesterday.  Worst abdominal pain was day he was seen at urgent care 06/27/2018.  Last vomiting prior to urgent care visit.  Denied diarrhea.  Abdomen bloating worst typically after eating lately.  Last po intake this morning keto muffin and almost immediate bloating noted by patient.  He reports he is feeling better today.  Last tums use this morning after muffin.  Denied fever/chills/body aches/rash/dysuria.    His most concerning symptom currently is GERD, sweaty/cold hands and dizziness intermittent.  Patient denied syncope episodes or palpitations/chest pain/dyspnea/SOB.  Patient is still taking clonazepam prn anxiety usually daily.  He denied personal or family history of stomach/intestine cancers/gallbladder problems/stomach ulcers/pancreatitis.  Discussed lab results in detail and verbalized to patient they were released to mychart also.  He is to follow up with RN Rolly Salter in 48 hours on Monday to get his handouts and repeat VS.  Reviewed ER precautions again e.g. black/tarry/bright red stools/emesis, worsening abdomen pain, syncope, chest pain, worst headache of life to seek same day re-evaluation.  Decrease intake of tums.  May increase omeprazole DR 20mg  to BID dosing.  Continue sucralfate 1gm po QID.  Discussed peptic ulcers pain usually worst prior to eating and during as stomach acid floods into area pain worsens.  Gallbladder pain typically after eating high fat meal and not uncommon to see gallstones with high fat diet which patient has been on since Oct 2019.  Peptic ulcers and gastritis would not be uncommon with his life stressors.  Acute viral gastroenteritis still on differentials also but elevated neutrophils could be related  to chronic inflammation (elevated LFTs)/creatinine.  Drink water to keep urine clear yellow tinged as at increased risk kidney stones with elevated serum calcium levels.  Discussed elevated calcium levels  could be causing his symptoms also and will recheck level next week after decreased TUMs use and add magnesium.  Continue to avoid alcohol intake and NSAIDs. Discussed iron and blood sugar are normal on this recheck.  Kidney function improved but still not normal.  Liver function improved but still not normal.  WBC normal no anemia. Patient to monitor GI symptoms to see if they are worst before, during or after meals and if any food triggers.  Discussed trial of non-keto diet to see if improvement in gastric symptoms e.g. avoid high fat, large portions meat, spicy and fried foods and an easy to digest bland diet rice/toast/bananas/potato baked/applesauce.  Follow up with PCM/consider repeat lipids in 3-6 months if he decides not to continue keto diet and increase dietary fiber.  Patient reported he has cut out added sugars in diet while following keto diet also.  Some patients have experienced headache/weakness/irritability/constipation/nausea and vomiting leading to dehydration/fatigue/dizziness/irritability/bad breath/sleep problems and muscle soreness/ketosis while on the keto diet per [employer].nlm.nih.gov.  Patient stated he may trial non-keto diet to see if improvement in symptoms along with omeprazole.  Discussed with patient he may need to be on omeprazole until his stressors decrease.  Reiterated we are in week 7 of covid-19 pandemic and still unsure when work schedules/society restrictions will return to prepandemic.  Discussed if he is having fever/chills/nausea/vomiting or diarrhea he should stay home for at least 48 hours but preferably 72 hours.  Push po clear fluids e.g. broths/soups/water/gatorade/pedialyte/powerade/ginger ale to avoid dehydration.  Patient agreed to see RN Rolly SalterHaley on Monday; follow up with me in clinic Tuesday and go to ER if worsening red flag symptoms this weekend.  Patient had no further questions at this time and verbalized understanding of information/instructions.  Total time on  phone 15 minutes

## 2018-07-13 ENCOUNTER — Telehealth: Payer: Self-pay | Admitting: Registered Nurse

## 2018-07-13 ENCOUNTER — Encounter: Payer: Self-pay | Admitting: Registered Nurse

## 2018-07-13 MED ORDER — ACETAMINOPHEN 500 MG PO TABS: 1000.0000 mg | ORAL_TABLET | Freq: Four times a day (QID) | ORAL | 0 refills | Status: AC | PRN

## 2018-07-13 NOTE — Progress Notes (Signed)
Requested Magnesium add on through [facility]. Awaiting confirmation via fax of specimen availability.

## 2018-07-13 NOTE — Progress Notes (Signed)
noted 

## 2018-07-13 NOTE — Telephone Encounter (Signed)
Patient contacted HR Rep Salley Slaughterim Watson today stating he was still having symptoms and unsure if related to covid-19 or his other health problems so he did not go into work today due to [place] pandemic workplace guidelines.  Patient contacted via telephone for re-evaluation after notified by Tim patient stayed home from work today.  Patient has still been experiencing cold sweaty hands, some intermittent shortness of breath, lightheaded, zoning out, headache intermittent, and  abdomen pain (improved since stopping keto diet and starting omeprazole DR 20mg  po BID prn on Saturday) and tylenol 1000mg  po QID prn pain, caffeine po prn headache.  The cold sweaty hands, zoning out were new last week  Last zoning out episode was yesterday when his wife was talking to him per patient which lasted a few minutes and resolved spontaneously.  Cold sweaty hands can occur with indoor or outdoor activity.  Still having some bloating after po intake but not as bad as last week with omeprazole/sucralfate and diet modification yesterday and today.  Denied fever/cough/rash/nausea/vomiting/diarrhea/sore throat, loss of taste or smell.  He has anxiety that sometimes includes shortness of breath, headache, GERD and sweating.  He has been avoiding NSAIDS and excedrin and tums after I spoke with him on Saturday (48 hours ago).  Patient was due to follow up with RN Rolly SalterHaley today for [facility] but he did not want to enter worksite if possible covid-19 symptoms.  Patient reported the shortness of breath similar to his anxiety shortness of breath episodes.  Discussed ER precautions with patient again e.g. signs of stroke e.g. trouble talking, unilateral weakness body, worst headache of life or vision changes; fever greater than 100.4, cough, wheezing, trouble breathing, chest pain or pressure and bluish lips/face to go to ER for re-evaluation.  Magnesium level from [facility] still pending.  High calcium and low  magnesium could mimic some of his covid symptoms.  Discussed with patient there is a slim chance this is covid but we do want to protect his coworkers and others if it is.  D[facility] some [facility] for outpatients but unsure if they would test him at UC/ER with current symptoms.  Recommended he stay home today and tomorrow and will re-evaluate him at 0900 tomorrow via telephone/possible video telehealth visit.  Consider repeat calcium and CBC tomorrow if slightly worsening symptoms. Will notify patient if covid-19 outpatient testing becomes available and where to present for swabbing.  HR Salley Slaughterim Watson notified patient instructed to stay home x 48 hours and extend if symptoms not improving or worsening to include other covid-19 e.g. cough, fever, myalgias, loss of taste/smell, or sore throat.  Patient currently with intermittent headache, chills, and shortness of breath covid-19 symptoms.   Otherwise continue plan of care as previously discussed.  Discussed again with patient these symptoms could be related to stress related headache/gastritis, infection, anxiety or a combination of all three.  I recommended he drink more water as he stated urine yellow clear, intermittent headache.  Mild dehydration could be the cause.  He stated he was not working outdoors today.  Consider caffeinated beverage for headache with or without tylenol 1000mg  po QID prn pain OTC.  Continue avoiding tums.  May eat dairy products per RDA guidelines.  Discussed with patient Leslie DalesUMS has a higher calcium level than many foods in normal serving size.  The body needs calcium for daily functioning.  Magnesium level can be lowered with omeprazole use and lab results pending.  RN Rolly Salter notified and will check with [facility] regarding patient magnesium results.    Patient verbalized understanding information/instructions, agreed with plan of care and had no further questions at this time.  Telephone call  duration 15 minutes.

## 2018-07-14 ENCOUNTER — Encounter: Payer: Self-pay | Admitting: Registered Nurse

## 2018-07-14 ENCOUNTER — Telehealth: Payer: Self-pay | Admitting: Registered Nurse

## 2018-07-14 NOTE — Telephone Encounter (Signed)
See results note dated 07/14/2018 contacted patient via telephone at 0900 this am for follow up.  Patient reported symptoms improved Sunday night feeling better less shortness of breath with activity.  He contacted [facility] and they did not have any other covid testing options or plan of care changes.  Patient disclosed to me today he was also taking men's multivitamin equate and he sent me picture of active ingredient label.  Noted to have 100mg  of calcium and under ingredient list without dose or RDA magnesium stearate also.  Patient notified me he was taking equate brand tums equivalent which did list magnesium on ingredient list also but TUMs brand name did not.  Patient also had been taking Alkaseltzer heartburn and gas relief last week and the label I could find online showed magnesium on ingredient list also.  Discussed with patient to stop all tums/equivalent, alkaseltzer and multivitamin use until labs nonfasting repeat in one week.  He will contact the [facility] covid-19 testing line to see if he eligible for testing through their resources. See email copy and paste below sent to patient at his work email [email or web address]  "Lucas Barrett, The telephone number is [phone] for the [facility] community testing line They screen to see if you meet criteria for testing and if yes will give you an appt and information on where to go  Their website states to be tested must have one or more of the following symptoms: COUGH, SHORTNESS OF BREATH or DIFFICULTY BREATHING, CHILLS, REPEAT SHAKING WITH CHILLS, MUSCLE PAIN, HEADACHE, SORE THROAT OR NEW LOSS OF TASTE OR SMELL.  You must also fall into one of these categories: Be over 98 years old Having underlying medical conditions including chronic lung disease or moderate to severe asthma; serious heart conditions, compromised immunity systems; severe obesity; diabetes; chronic kidney disease and undergoing dialysis; or liver disease.  Liver disease is the only  category you currently meet testing requirements with your elevated liver enzymes of ALT, AST and GGT since 2015. You have had mild obesity with BMI 33 2014-2018. You also have chronic kidney disease with elevated creatinine and decreased GFR but not on dialysis. Sincerely, Lucas Billet NP-C   Thank you it does have some magnesium stearate as "inactive ingredient" so unsure how many mg or how much of it you are absorbing. I would not take this for a week and see how your symptoms change and with labs next week also. You will not be fasting for labs to schedule with RN Lucas Barrett.  Attempted to paste photo patient sent me of multivitamin label and Epic stated unsupported file format.  Patient reported at 1200 that he tried calling the [facility] line but it was busy and he was going to try calling it again later this afternoon.

## 2018-07-15 LAB — MAGNESIUM: Magnesium: 2.4 mg/dL — ABNORMAL HIGH (ref 1.6–2.3)

## 2018-07-15 LAB — SPECIMEN STATUS REPORT

## 2018-07-16 ENCOUNTER — Encounter: Payer: Self-pay | Admitting: Registered Nurse

## 2018-07-16 ENCOUNTER — Telehealth: Payer: Self-pay | Admitting: Registered Nurse

## 2018-07-16 NOTE — Telephone Encounter (Signed)
Patient left message test for covid-19 completed yesterday [facility] at 1100 he is supposed to call for his results on 11 May and wondering what he is supposed to do regarding work.  Attempted to reach patient via telephone.  No answer left voicemail to call clinic.

## 2018-07-16 NOTE — Telephone Encounter (Signed)
Patient returned call.  He stated he had "brain swab" completed yesterday [facility] site.  He is to call specified number Monday 11 May for his results.  He spoke with HR Rep Tim yesterday and was told to stay at home until Covid-19 results available.  I agree with plan to prevent possible spread to coworkers if his test positive.  Patient reported he is feeling better today still having some cold/sweaty hands, fatigue but shortness of breath at rest resolved and yesterday with exertion no shortness of breath.  Going to try and walk later today to see if shortness of breath returns.  Patient had taken omeprazole DR 20mg  po BID until yesterday only took one dose.  Having some abdomen bloating but reflux/heartburn/sore throat symptoms resolved.  Patient scheduled lab draw for next week with RN Rolly Salter and had no further questions at this time.  Will continue plan of care as previously discussed except for return to work date TBD based on Covid-19 testing results and symptoms.  Patient verbalized understanding information/instructions, agreed with plan of care and had no further questions at this time.

## 2018-07-18 ENCOUNTER — Emergency Department (HOSPITAL_COMMUNITY)
Admission: EM | Admit: 2018-07-18 | Discharge: 2018-07-18 | Disposition: A | Discharge status: Home / Self Care | Payer: No Typology Code available for payment source | Attending: Emergency Medicine | Admitting: Emergency Medicine

## 2018-07-18 ENCOUNTER — Emergency Department (HOSPITAL_COMMUNITY): Payer: No Typology Code available for payment source

## 2018-07-18 ENCOUNTER — Other Ambulatory Visit: Payer: Self-pay

## 2018-07-18 ENCOUNTER — Encounter (HOSPITAL_COMMUNITY): Payer: Self-pay

## 2018-07-18 DIAGNOSIS — R000 Tachycardia, unspecified: Secondary | ICD-10-CM | POA: Diagnosis not present

## 2018-07-18 DIAGNOSIS — I10 Essential (primary) hypertension: Secondary | ICD-10-CM | POA: Diagnosis not present

## 2018-07-18 DIAGNOSIS — R0602 Shortness of breath: Secondary | ICD-10-CM | POA: Insufficient documentation

## 2018-07-18 LAB — BASIC METABOLIC PANEL
Anion gap: 10 (ref 5–15)
BUN: 18 mg/dL (ref 6–20)
CO2: 24 mmol/L (ref 22–32)
Calcium: 10 mg/dL (ref 8.9–10.3)
Chloride: 105 mmol/L (ref 98–111)
Creatinine, Ser: 1.34 mg/dL — ABNORMAL HIGH (ref 0.61–1.24)
GFR calc Af Amer: >60 mL/min (ref >60)
GFR calc non Af Amer: >60 mL/min (ref >60)
Glucose, Bld: 119 mg/dL — ABNORMAL HIGH (ref 70–99)
Potassium: 3.7 mmol/L (ref 3.5–5.1)
Sodium: 139 mmol/L (ref 135–145)

## 2018-07-18 LAB — CBC
HCT: 52.7 % — ABNORMAL HIGH (ref 39.0–52.0)
Hemoglobin: 17.3 g/dL — ABNORMAL HIGH (ref 13.0–17.0)
MCH: 28.5 pg (ref 26.0–34.0)
MCHC: 32.8 g/dL (ref 30.0–36.0)
MCV: 87 fL (ref 80.0–100.0)
Platelets: 291 10*3/uL (ref 150–400)
RBC: 6.06 MIL/uL — ABNORMAL HIGH (ref 4.22–5.81)
RDW: 13.6 % (ref 11.5–15.5)
WBC: 9.2 10*3/uL (ref 4.0–10.5)
nRBC: 0 % (ref 0.0–0.2)

## 2018-07-18 LAB — D-DIMER, QUANTITATIVE: D-Dimer, Quant: <0.27 ug/mL-FEU (ref 0.00–0.50)

## 2018-07-18 MED ORDER — IOHEXOL 350 MG/ML SOLN
100.0000 mL | Freq: Once | INTRAVENOUS | Status: AC | PRN
  Administered 2018-07-18: 100 mL via INTRAVENOUS

## 2018-07-18 MED ORDER — SODIUM CHLORIDE 0.9 % IV BOLUS
1000.0000 mL | Freq: Once | INTRAVENOUS | Status: AC
  Administered 2018-07-18: 17:00:00 1000 mL via INTRAVENOUS

## 2018-07-18 MED ORDER — METOPROLOL TARTRATE 5 MG/5ML IV SOLN
5.0000 mg | Freq: Once | INTRAVENOUS | Status: AC
  Administered 2018-07-18: 5 mg via INTRAVENOUS
  Filled 2018-07-18: qty 5

## 2018-07-18 MED ORDER — LORAZEPAM 1 MG PO TABS
1.0000 mg | ORAL_TABLET | Freq: Once | ORAL | Status: AC
  Administered 2018-07-18: 1 mg via ORAL
  Filled 2018-07-18: qty 1

## 2018-07-18 NOTE — ED Triage Notes (Signed)
Pt came in with c/o chest tightness, numbness in hands and shortness of breath x2 weeks

## 2018-07-18 NOTE — Discharge Instructions (Addendum)
Your evaluation here revealed a high heart rate.  You have normal lab work.  You have no evidence of pneumonia or infiltrate in your lung.  You also have no evidence of pulmonary embolism or blood clots to your lung.  Heart rate has improved with IV fluids and 1 dose of medication.  You appear stable for discharge.  Please follow-up with your primary care doctor and return here if you are worse at any time especially increased shortness of breath, fever, chills, or chest pain.

## 2018-07-18 NOTE — ED Notes (Signed)
Pt had brought in a knife by accident that was taken to security on arrival.  Knife returned to patient on d/c.

## 2018-07-18 NOTE — ED Notes (Signed)
Patient verbalizes understanding of discharge instructions. Opportunity for questioning and answers were provided. Armband removed by staff, pt discharged from ED.  Ambulatory to waiting room to be picked up by wife

## 2018-07-18 NOTE — ED Triage Notes (Signed)
Pt in with sob and chest tightness x a few days. Denies any cough, fevers or n/v.

## 2018-07-18 NOTE — ED Provider Notes (Signed)
[facility] EMERGENCY DEPARTMENT Provider Note   CSN: [id number] Arrival date & time: 07/18/18  1411    History   Chief Complaint Chief Complaint  Patient presents with  . Shortness of Breath  . Numbness  . Chest Pain    HPI Lucas Barrett is a 33 y.o. male.     HPI  33 year old male presents today complaining of dyspnea.  Dyspnea is no worse with exertion.  The symptoms have been present for 2 weeks.  A week prior to this he had some GI symptoms.  He denies nausea, vomiting, diarrhea.  He reports that about 2 weeks ago he been began having some dyspnea.  He denies runny nose, sore throat, cough, fever, or chills.  He had a COVID test 3 days ago but results will not be back until Monday.  He denies any exposure to anyone with known COVID or travel out of state.  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  He reports his hands intermittently get numb with the dyspnea.  He has had some sweating and reports that his hands go from being dry to wet.  He denies any illicit drug use but does report that he had an energy drink today.  Denies history of DVT, PE, immobilization, recent surgery, or long distance travel  Past Medical History:  Diagnosis Date  . Anxiety   . Hypertension     Patient Active Problem List   Diagnosis Date Noted  . Panic attacks 06/02/2014    History reviewed. No pertinent surgical history.      Home Medications    Prior to Admission medications   Medication Sig Start Date End Date Taking? Authorizing Provider  clonazePAM (KLONOPIN) 1 MG tablet Take one tablet my mouth once daily as needed for panic attacks. Patient taking differently: Take 1 mg by mouth daily as needed for anxiety. Take one tablet my mouth once daily as needed for panic attacks. 06/02/14   Doreene Nest, NP  omeprazole (PRILOSEC) 20 MG capsule Take 1 capsule (20 mg total) by mouth daily. 07/10/18   Betancourt, Jarold Song, NP  sodium chloride (OCEAN) 0.65 % SOLN nasal spray  Place 2 sprays every 2 (two) hours while awake into both nostrils. 01/21/17 07/10/18  Betancourt, Jarold Song, NP  sucralfate (CARAFATE) 1 GM/10ML suspension Take 10 mLs (1 g total) by mouth 4 (four) times daily -  with meals and at bedtime. 07/10/18   Betancourt, Jarold Song, NP    Family History Family History  Problem Relation Age of Onset  . Hypertension Mother     Social History Social History   Tobacco Use  . Smoking status: Never Smoker  . Smokeless tobacco: Never Used  Substance Use Topics  . Alcohol use: No    Alcohol/week: 0.0 standard drinks  . Drug use: No     Allergies   Patient has no known allergies.   Review of Systems Review of Systems  Constitutional: Positive for activity change, diaphoresis, fatigue and unexpected weight change. Negative for appetite change, chills and fever.  HENT: Negative.   Eyes: Negative.   Respiratory: Positive for shortness of breath.   Cardiovascular: Negative.  Negative for chest pain and leg swelling.  Gastrointestinal: Negative.   Endocrine: Negative.   Genitourinary: Negative.   Musculoskeletal: Negative.   Skin: Negative.   Neurological: Negative.   Hematological: Negative.   Psychiatric/Behavioral: Negative.   All other systems reviewed and are negative.    Physical Exam Updated  Vital Signs BP 122/84 (BP Location: Right Arm)   Pulse (!) 103   Temp 98 F (36.7 C) (Oral)   Resp 20   Ht 1.829 m (6')   Wt 90.7 kg   SpO2 99%   BMI 27.12 kg/m   Physical Exam Vitals signs and nursing note reviewed.  Constitutional:      General: He is not in acute distress.    Appearance: He is well-developed and normal weight. He is not ill-appearing, toxic-appearing or diaphoretic.  HENT:     Head: Normocephalic and atraumatic.     Mouth/Throat:     Mouth: Mucous membranes are moist.  Eyes:     Extraocular Movements: Extraocular movements intact.     Pupils: Pupils are equal, round, and reactive to light.  Neck:      Musculoskeletal: Normal range of motion.  Cardiovascular:     Rate and Rhythm: Tachycardia present.  Pulmonary:     Effort: Pulmonary effort is normal.     Breath sounds: Normal breath sounds. No decreased breath sounds.  Chest:     Chest wall: No mass, deformity or crepitus.  Abdominal:     General: Bowel sounds are normal.     Palpations: Abdomen is soft.  Musculoskeletal: Normal range of motion.     Right lower leg: He exhibits no tenderness. No edema.     Left lower leg: He exhibits no tenderness. No edema.  Skin:    General: Skin is warm and dry.     Capillary Refill: Capillary refill takes less than 2 seconds.  Neurological:     General: No focal deficit present.     Mental Status: He is alert and oriented to person, place, and time.  Psychiatric:        Mood and Affect: Mood normal.        Behavior: Behavior normal.      ED Treatments / Results  Labs (all labs ordered are listed, but only abnormal results are displayed) Labs Reviewed - No data to display  EKG EKG Interpretation  Date/Time:  Saturday Jul 18 2018 14:24:39 EDT Ventricular Rate:  116 PR Interval:    QRS Duration: 101 QT Interval:  311 QTC Calculation: 432 R Axis:   -57 Text Interpretation:  Sinus tachycardia Non-specific ST-t changes Confirmed by Margarita Grizzleay, Theola Cuellar [phone]([id number]) on 07/18/2018 4:04:23 PM   Radiology Dg Chest Port 1 View  Result Date: 07/18/2018 CLINICAL DATA:  Dyspnea, chest tightness, hand numbness x2 weeks. Hx of hypertension. EXAM: PORTABLE CHEST 1 VIEW COMPARISON:  04/04/2015 FINDINGS: The heart size and mediastinal contours are within normal limits. Both lungs are clear. The visualized skeletal structures are unremarkable. IMPRESSION: No active disease. Electronically Signed   By: Gaylyn RongWalter  Liebkemann M.D.   On: 07/18/2018 15:14    Procedures Procedures (including critical care time)  Medications Ordered in ED Medications - No data to display   Initial Impression / Assessment and Plan  / ED Course  I have reviewed the triage vital signs and the nursing notes.  Pertinent labs & imaging results that were available during my care of the patient were reviewed by me and considered in my medical decision making (see chart for details).      33 yo male with dyspnea and tachycardia.  No fever.  Covid test pending op.  D-dimer negative here.  Other labs normal except creatinine at 1.34 which is down from first prior of 07/10/18 CT Angie obtained shows no evidence of pulmonary embolism.  Also  no nodule seen and no evidence of infiltrates Patient received 1 L normal saline and 5 mg of Lopressor heart rate is down to 90 and he has feels improved.  Patient appears stable for discharge.  Discussed return precautions need for follow-up and he voiced understanding Final Clinical Impressions(s) / ED Diagnoses   Final diagnoses:  Shortness of breath  Tachycardia    ED Discharge Orders    None       Margarita Grizzle, MD 07/18/18 1755

## 2018-07-20 ENCOUNTER — Telehealth: Payer: Self-pay | Admitting: *Deleted

## 2018-07-20 NOTE — Telephone Encounter (Signed)
Phone call to pt to check in on sx and COVID testing results. Pt reports he has called phone line he was given by testing site twice today. No answer, left message and planning to keep trying a few more times. Instructed to call or email RN once results available.  He sts sx were worst he had since start of sx over this past weekend. Ended up going to ED 2/2 weight on chest causing ShOB, very fatigued. Labs reassuring, Ca+ improved to WNL, Creatinine trending down but still elevated. CT angio clear. D/c home. Yesterday (Sunday 5/10) he had severe body aches and fatigue "couldn't get myself out of bed until 1 or 2 in the afternoon." Some GI sharp pains yesterday as well, no n/v/d. Pt also reports that one day last week he had numbness to entire L side of body for a short period, unable to recall how long but it resolved on its own through the day. Case discussed with NP via phone giving update and neither RN nor NP were previously aware of this sx.  Today he reports "best start to the day so far in the past few weeks." No ShOB, chest pain, numbness/tingling, body aches today. Advised pt to continue home quarantine until results are in. He has already alerted HR at [employer] to so far unavailable pending results today. Encouraged to continue plan of care established with NP and to call with any result updates or change in sx. Will f/u with pt again tomorrow if no response back today. He verbalizes understanding and agreement with plan of care and has no further questions/concerns.

## 2018-07-21 ENCOUNTER — Telehealth: Payer: Self-pay | Admitting: Registered Nurse

## 2018-07-21 ENCOUNTER — Encounter: Payer: Self-pay | Admitting: Registered Nurse

## 2018-07-21 NOTE — Telephone Encounter (Signed)
Patient contacted via telephone he stated feeling well and received negative Covid test results from [facility] testing site staff this am.  HR Rep Salley Slaughter requesting patient stay out from work today due to ER visit and possible exposure to Covid 19 at healthcare facility as there for IV fluids/exam 4 hours per patient.  Patient reported he wore mask the entire time he was at ER.  Reviewed labs drawn at ER in Epic with patient calcium returned to normal.  CXR and WBCs, GFR normal. sp02 RA 99%   Creatinine improved to 1.34 but still elevated.  EKG sinus tach received fluids IV 1 liter NS and 5mg  lopressor.   Will hold on another blood draw at [facility] at this time.  Consider magnesium follow up if consistently taking omeprazole over the next three months. Routine follow up with [facility] consider LFTs/chemistry with magnesium in 3 months nonfasting with lipids.  Patient stated he has been following low carbohydrate diet switched chicken for red meat.  Denied bloating/stomach pain/changes in stool consistentcy/color with dietary changes/omeprazole.  Denied fever, cough, SOB, chest pain, headache, visual changes, extremity weakness, dysphagia, or cold hands/tingling.  Feeling well denied new symptoms. Patient agreed with holding on repeat serum testing at this time "I don't feel like having more needles at this time".  He stated he is planning to return to work tomorrow as long as no new symptoms.  Reminded patient [facility] clinic closed Wednesday and staff will return Thursday 0830. Reiterated for patient to review his email regarding return to work instructions from HR sent last Thursday 07/16/2018.   Patient verbalized understanding information/instructions, agreed with plan of care and had no further questions at this time.

## 2018-07-21 NOTE — Telephone Encounter (Signed)
Noted, agreed with plan of care.  Awaiting results from [facility] not resulted automatically in Epic they are faxed to centralized number [facility] which will input results to Epic for [facility] patients.  Patient aware of [place] Fl[place] to return to ER for re-evaluation.  If patient reports positive results will notify other agencies and HR [employer] for contact tracing/notification.  Results still pending this am.

## 2018-09-01 ENCOUNTER — Other Ambulatory Visit: Payer: Self-pay | Admitting: *Deleted

## 2018-09-01 MED ORDER — SUCRALFATE 1 GM/10ML PO SUSP: 1.0000 g | Freq: Three times a day (TID) | ORAL | 0 refills | Status: DC

## 2018-09-01 NOTE — Telephone Encounter (Signed)
Pt reports acid reflux and stomach pains flaring again. Sts "when I lay down at night at have this burning sensation that just goes straight up my chest." Pt had stopped Omeprazole as he had understood that it was temporary/trial and felt well. Has approx 30 pills left at home. He restarted this BID 5 days ago due to sx but sx have continued. Due to Omeprazole needing approx 2 weeks for full effect, pt requesting Sucralfate to use as he did previously over the next 7-10 days. Preferred pharmacy confirmed. Pt to rtc in 1-2 weeks to update RN on sx and receive refill once NP back from vacation. Thinks he has enough tabs at home until then. Discussed with pt that if BID starts controlling sx well, he can reduce to once daily and then increase back if sx return. ER precautions reviewed. He verbalizes understanding and agreement with plan of care.

## 2018-09-29 ENCOUNTER — Encounter: Payer: Self-pay | Admitting: Registered Nurse

## 2018-09-29 ENCOUNTER — Telehealth: Payer: Self-pay | Admitting: Registered Nurse

## 2018-09-29 MED ORDER — OMEPRAZOLE 40 MG PO CPDR: 40.0000 mg | DELAYED_RELEASE_CAPSULE | Freq: Every day | ORAL | 0 refills | Status: DC

## 2018-09-29 NOTE — Telephone Encounter (Signed)
Patient has been taking omeprazole 20mg  po BID and he tried to cut back to 20mg  po daily as he doesn't like BID dosing but then having heartburn symptoms at night.  Will start him on omeprazole DR 40mg  po daily #90 RF0 to see if still having breathrough heartburn symptoms.  If well controlled and medication required daily magnesium level in 6 months.  Patient agreed with plan of care and had no further questions at this time.

## 2018-09-29 NOTE — Telephone Encounter (Signed)
Pt reported at med pickup that his worst sx occur in late evening/nighttime, especially when lying down. NP and RN advised pt that he could try switching to taking daily dose in the early evening before dinner to try to help reduce these nighttime sx. He is agreeable to this. Recommended pt f/u in clinic after one week with update on sx control. If morning sx become an issue again, then will need to consider a different medication for management per NP. Pt sts understanding. No further questions/concerns.

## 2018-12-03 ENCOUNTER — Telehealth: Payer: Self-pay | Admitting: Registered Nurse

## 2018-12-03 NOTE — Telephone Encounter (Signed)
Noted agree.  Discussed with patient chronic GERD symptoms require follow up h pylori testing not available in [facility] and may be indicated that he have EGD.  Omeprazole DR 40mg  po daily at 1500 working better than 20mg  Omeprazole BID per patient.  He states he has been following bland diet; avoiding trigger foods and having sensation he needs to belch frequently and some upper abdomen pain.  Continue omeprazole 40mg  DR po daily and refilled bridge carafate 62ml po QID with meals and bedtime #420 RF0 electronic Rx to his pharmacy of choice.  RN Hildred Alamin gave him list of [facility] and GI providers to choose his providers and if referral requested by office we will send in for him.  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  Patient verbalized understanding information/instructions, agreed with plan of care and no further questions at this time.

## 2018-12-03 NOTE — Telephone Encounter (Signed)
Pt emailed RN stating his "stomach has been acting up again" and that he requested a refill through [facility]. Discussed refill with NP. NP and RN spoke with pt in clinic informing him that bridge refill will be provided, but that he needs to get established with primary care and Gertie Fey as his sx are occurring frequently since at least April 2020 ED visit and are considered uncontrolled on current regimen. List of [employer] covered Gastro providers provided to pt. If GI office informs him he needs a referral, pt to inform clinic and referral will be placed by NP. Pt reports stressors that were present during previous flares are still present, along with current house move and spouse now pregnant. Pt will continue to try to manage stressors to reduce sx. Pt denies further questions/concerns.

## 2019-01-18 ENCOUNTER — Ambulatory Visit: Payer: Self-pay | Admitting: *Deleted

## 2019-01-18 ENCOUNTER — Other Ambulatory Visit: Payer: Self-pay

## 2019-01-18 DIAGNOSIS — L600 Ingrowing nail: Secondary | ICD-10-CM

## 2019-01-18 NOTE — Progress Notes (Signed)
Pt with c/o ingrown toenail with drainage, swelling and pain. R great toe. Noted ingrown nail 5 days ago. Wife attempted to cut it out. Pt sts she cut too deep. Pain increased and it began draining pus and blood over the past 3-4 days. Now with swelling and erythema to distal and lateral aspects of R great toe. Pain increases with wt bearing. Has been walking on the outside of his foot to reduce pressure on toe and has caused lower leg pain. Has been cleaning it at home once a day and applying neosporin. No soaks. Tried to wrap in guaze but the extra pressure and sensation caused increased pain.  Advised pt to begin epsom salt soaks for 87min 3x/day. Advised he should see increased drainage, but an improvement in pain, swelling and redness. Pt believes he will be able to soak it tonight x1 and in the am x1. Plans to email RN tomorrow morning with update. If unchanged or improving, plan to f/u in clinic on Thursday 11/12. If worsening by tomorrow, pt will see NP in clinic 11/10. Tylenol 650mg  po q6h prn pain. Pt is agreeable to plan and has no further questions/concerns.

## 2019-02-11 ENCOUNTER — Telehealth: Payer: Self-pay | Admitting: *Deleted

## 2019-02-11 DIAGNOSIS — Z20822 Contact with and (suspected) exposure to covid-19: Secondary | ICD-10-CM

## 2019-02-11 NOTE — Telephone Encounter (Signed)
RN made aware by HR of pt calling out from work earlier this week due to GI complaints. Went off diet for Thanksgiving holiday for 4 days and was having some bloating and loose stools starting mid Saturday 11/28. This is normal for him with major diet shifts. After resuming regular diet, sx typically improve but instead he developed nausea and body aches on Monday 11/30. He was at work when this started and left work early. Returned on Tuesday feeling better but sx resumed during the day and was sent home and advised to remain out of work while awaiting [facility] consultation for covid testing determination.   Sx improved today but still with some bloating and intermittent loose stools. No nausea or body aches currently.  Recommend covid testing. [place] site closet to pt but he reports he may try to schedule at a [facility] for [facility] up testing out of convenience due to longer wait times at [facility] site. Advised pt this was fine but that he will be responsible for contacting clinic and HR when results are received since not always able to view in Epic immediately. Pt verbalizes understanding and agreement. Order placed for community testing site in case he is unable to get [facility].   Day 1 sx 11/29. Testing on Day 6, 02/12/19. Scheduled last day of quarantine on Tuesday 12/8 with return to work 02/17/19 as long as negative test, improving sx and no fever >24hrs. RN reviewed with pt additional sx that could develop including severe sx that would require emergent/same day care eg ShOB/difficulty breathing, confusion, bluish tint to lips/face, syncope. Pt denies any further questions/concerns. RN will inform HR manager of pending testing.

## 2019-02-11 NOTE — Telephone Encounter (Signed)
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

## 2019-02-12 ENCOUNTER — Other Ambulatory Visit: Payer: Self-pay

## 2019-02-12 DIAGNOSIS — Z20822 Contact with and (suspected) exposure to covid-19: Secondary | ICD-10-CM

## 2019-02-15 LAB — NOVEL CORONAVIRUS, NAA: SARS-CoV-2, NAA: NOT DETECTED

## 2019-02-15 NOTE — Progress Notes (Signed)
Spoke with pt over phone. Pt confirms viewing NP Betancourt's Mychart mesg this morning. Verbalizes understanding of continued quarantine and return to work eligibility. Denies any continued or new sx. Feels well. No further questions/concerns.

## 2019-02-15 NOTE — Progress Notes (Signed)
noted 

## 2019-02-18 NOTE — Telephone Encounter (Signed)
Covid test was negative and patient notified was supposed to return to work 02/17/2019 please verify patient returned as planned.

## 2019-02-19 NOTE — Telephone Encounter (Signed)
noted 

## 2019-02-19 NOTE — Telephone Encounter (Signed)
Pt returned to work as planned on 12/9.

## 2019-03-18 ENCOUNTER — Ambulatory Visit: Payer: Self-pay | Admitting: Registered Nurse

## 2019-03-18 ENCOUNTER — Other Ambulatory Visit: Payer: Self-pay

## 2019-03-18 VITALS — BP 124/82 | HR 89 | Temp 97.1°F

## 2019-03-18 DIAGNOSIS — M62830 Muscle spasm of back: Secondary | ICD-10-CM

## 2019-03-18 DIAGNOSIS — S20411A Abrasion of right back wall of thorax, initial encounter: Secondary | ICD-10-CM

## 2019-03-18 DIAGNOSIS — S46911A Strain of unspecified muscle, fascia and tendon at shoulder and upper arm level, right arm, initial encounter: Secondary | ICD-10-CM

## 2019-03-18 MED ORDER — BIOFREEZE 4 % EX GEL: 1.0000 "application " | Freq: Four times a day (QID) | CUTANEOUS | Status: AC | PRN

## 2019-03-18 MED ORDER — IBUPROFEN 800 MG PO TABS: 800.0000 mg | ORAL_TABLET | Freq: Three times a day (TID) | ORAL | 0 refills | Status: AC

## 2019-03-18 MED ORDER — SALINE SPRAY 0.65 % NA SOLN: 2.0000 | NASAL | 0 refills | Status: DC

## 2019-03-18 MED ORDER — OMEPRAZOLE 40 MG PO CPDR: 40.0000 mg | DELAYED_RELEASE_CAPSULE | Freq: Every day | ORAL | 0 refills | Status: DC

## 2019-03-18 MED ORDER — CYCLOBENZAPRINE HCL 10 MG PO TABS: 5.0000 mg | ORAL_TABLET | Freq: Three times a day (TID) | ORAL | 0 refills | Status: AC | PRN

## 2019-03-18 NOTE — Patient Instructions (Signed)
Muscle Cramps and Spasms Muscle cramps and spasms occur when a muscle or muscles tighten and you have no control over this tightening (involuntary muscle contraction). They are a common problem and can develop in any muscle. The most common place is in the calf muscles of the leg. Muscle cramps and muscle spasms are both involuntary muscle contractions, but there are some differences between the two:  Muscle cramps are painful. They come and go and may last for a few seconds or up to 15 minutes. Muscle cramps are often more forceful and last longer than muscle spasms.  Muscle spasms may or may not be painful. They may also last just a few seconds or much longer. Certain medical conditions, such as diabetes or Parkinson's disease, can make it more likely to develop cramps or spasms. However, cramps or spasms are usually not caused by a serious underlying problem. Common causes include:  Doing more physical work or exercise than your body is ready for (overexertion).  Overuse from repeating certain movements too many times.  Remaining in a certain position for a long period of time.  Improper preparation, form, or technique while playing a sport or doing an activity.  Dehydration.  Injury.  Side effects of some medicines.  Abnormally low levels of the salts and minerals in your blood (electrolytes), especially potassium and calcium. This could happen if you are taking water pills (diuretics) or if you are pregnant. In many cases, the cause of muscle cramps or spasms is not known. Follow these instructions at home: Managing pain and stiffness      Try massaging, stretching, and relaxing the affected muscle. Do this for several minutes at a time.  If directed, apply heat to tight or tense muscles as often as told by your health care provider. Use the heat source that your health care provider recommends, such as a moist heat pack or a heating pad. ? Place a towel between your skin and  the heat source. ? Leave the heat on for 20-30 minutes. ? Remove the heat if your skin turns bright red. This is especially important if you are unable to feel pain, heat, or cold. You may have a greater risk of getting burned.  If directed, put ice on the affected area. This may help if you are sore or have pain after a cramp or spasm. ? Put ice in a plastic bag. ? Place a towel between your skin and the bag. ? Leavethe ice on for 20 minutes, 2-3 times a day.  Try taking hot showers or baths to help relax tight muscles. Eating and drinking  Drink enough fluid to keep your urine pale yellow. Staying well hydrated may help prevent cramps or spasms.  Eat a healthy diet that includes plenty of nutrients to help your muscles function. A healthy diet includes fruits and vegetables, lean protein, whole grains, and low-fat or nonfat dairy products. General instructions  If you are having frequent cramps, avoid intense exercise for several days.  Take over-the-counter and prescription medicines only as told by your health care provider.  Pay attention to any changes in your symptoms.  Keep all follow-up visits as told by your health care provider. This is important. Contact a health care provider if:  Your cramps or spasms get more severe or happen more often.  Your cramps or spasms do not improve over time. Summary  Muscle cramps and spasms occur when a muscle or muscles tighten and you have no control over this   tightening (involuntary muscle contraction).  The most common place for cramps or spasms to occur is in the calf muscles of the leg.  Massaging, stretching, and relaxing the affected muscle may relieve the cramp or spasm.  Drink enough fluid to keep your urine pale yellow. Staying well hydrated may help prevent cramps or spasms. This information is not intended to replace advice given to you by your health care provider. Make sure you discuss any questions you have with your  health care provider. Document Revised: 07/21/2017 Document Reviewed: 07/21/2017 Elsevier Patient Education  2020 [employer]. Thoracic Strain Rehab Ask your health care provider which exercises are safe for you. Do exercises exactly as told by your health care provider and adjust them as directed. It is normal to feel mild stretching, pulling, tightness, or discomfort as you do these exercises. Stop right away if you feel sudden pain or your pain gets worse. Do not begin these exercises until told by your health care provider. Stretching and range-of-motion exercise This exercise warms up your muscles and joints and improves the movement and flexibility of your back and shoulders. This exercise also helps to relieve pain. Chest and spine stretch  1. Lie down on your back on a firm surface. 2. Roll a towel or a small blanket so it is about 4 inches (10 cm) in diameter. 3. Put the towel lengthwise under the middle of your back so it is under your spine, but not under your shoulder blades. 4. Put your hands behind your head and let your elbows fall to your sides. This will increase your stretch. 5. Take a deep breath (inhale). 6. Hold for ___5-15_______ seconds. 7. Relax after you breathe out (exhale). Repeat ____3______ times. Complete this exercise ______3____ times a day. Strengthening exercises These exercises build strength and endurance in your back and your shoulder blade muscles. Endurance is the ability to use your muscles for a long time, even after they get tired. Alternating arm and leg raises  1. Get on your hands and knees on a firm surface. If you are on a hard floor, you may want to use padding, such as an exercise mat, to cushion your knees. 2. Line up your arms and legs. Your hands should be directly below your shoulders, and your knees should be directly below your hips. 3. Lift your left leg behind you. At the same time, raise your right arm and straighten it in front of  you. ? Do not lift your leg higher than your hip. ? Do not lift your arm higher than your shoulder. ? Keep your abdominal and back muscles tight. ? Keep your hips facing the ground. ? Do not arch your back. ? Keep your balance carefully, and do not hold your breath. 4. Hold for ___5-15_______ seconds. 5. Slowly return to the starting position and repeat with your right leg and your left arm. Repeat ____3______ times. Complete this exercise _____3_____ times a day. Straight arm rows This exercise is also called shoulder extension exercise. 1. Stand with your feet shoulder width apart. 2. Secure an exercise band to a stable object in front of you so the band is at or above shoulder height. 3. Hold one end of the exercise band in each hand. 4. Straighten your elbows and lift your hands up to shoulder height. 5. Step back, away from the secured end of the exercise band, until the band stretches. 6. Squeeze your shoulder blades together and pull your hands down to the sides  of your thighs. Stop when your hands are straight down by your sides. This is shoulder extension. Do not let your hands go behind your body. 7. Hold for ___5-15_______ seconds. 8. Slowly return to the starting position. Repeat ___3_______ times. Complete this exercise ____3______ times a day. Prone shoulder external rotation 1. Lie on your abdomen on a firm bed so your left / right forearm hangs over the edge of the bed and your upper arm is on the bed, straight out from your body. This is the prone position. ? Your elbow should be bent. ? Your palm should be facing your feet. 2. If instructed, hold a ____none______ weight in your hand. 3. Squeeze your shoulder blade toward the middle of your back. Do not let your shoulder lift toward your ear. 4. Keep your elbow bent in a 90-degree angle (right angle) while you slowly move your forearm up toward the ceiling. Move your forearm up to the height of the bed, toward your head.  This is external rotation. ? Your upper arm should not move. ? At the top of the movement, your palm should face the floor. 5. Hold for ___5-15_______ seconds. 6. Slowly return to the starting position and relax your muscles. Repeat ____3______ times. Complete this exercise _____3_____ times a day. Rowing scapular retraction This is an exercise in which the shoulder blades (scapulae) are pulled toward each other (retraction). 1. Sit in a stable chair without armrests, or stand up. 2. Secure an exercise band to a stable object in front of you so the band is at shoulder height. 3. Hold one end of the exercise band in each hand. Your palms should face down. 4. Bring your arms out straight in front of you. 5. Step back, away from the secured end of the exercise band, until the band stretches. 6. Pull the band backward. As you do this, bend your elbows and squeeze your shoulder blades together, but avoid letting the rest of your body move. Do not shrug your shoulders upward while you do this. 7. Stop when your elbows are at your sides or slightly behind your body. 8. Hold for ____5-15______ seconds. 9. Slowly straighten your arms to return to the starting position. Repeat ___3_____ times. Complete this exercise _____3_____ times a day. Posture and body mechanics Good posture and healthy body mechanics can help to relieve stress in your body's tissues and joints. Body mechanics refers to the movements and positions of your body while you do your daily activities. Posture is part of body mechanics. Good posture means:  Your spine is in its natural S-curve position (neutral).  Your shoulders are pulled back slightly.  Your head is not tipped forward. Follow these guidelines to improve your posture and body mechanics in your everyday activities. Standing   When standing, keep your spine neutral and your feet about hip width apart. Keep a slight bend in your knees. Your ears, shoulders, and hips  should line up with each other.  When you do a task in which you lean forward while standing in one place for a long time, place one foot up on a stable object that is 2-4 inches (5-10 cm) high, such as a footstool. This helps keep your spine neutral. Sitting   When sitting, keep your spine neutral and keep your feet flat on the floor. Use a footrest, if necessary, and keep your thighs parallel to the floor. Avoid rounding your shoulders, and avoid tilting your head forward.  When working at a  desk or a computer, keep your desk at a height where your hands are slightly lower than your elbows. Slide your chair under your desk so you are close enough to maintain good posture.  When working at a computer, place your monitor at a height where you are looking straight ahead and you do not have to tilt your head forward or downward to look at the screen. Resting When lying down and resting, avoid positions that are most painful for you.  If you have pain with activities such as sitting, bending, stooping, or squatting (flexion-basedactivities), lie in a position in which your body does not bend very much. For example, avoid curling up on your side with your arms and knees near your chest (fetal position).  If you have pain with activities such as standing for a long time or reaching with your arms (extension-basedactivities), lie with your spine in a neutral position and bend your knees slightly. Try the following positions: ? Lie on your side with a pillow between your knees. ? Lie on your back with a pillow under your knees.  Lifting   When lifting objects, keep your feet at least shoulder width apart and tighten your abdominal muscles.  Bend your knees and hips and keep your spine neutral. It is important to lift using the strength of your legs, not your back. Do not lock your knees straight out.  Always ask for help to lift heavy or awkward objects. This information is not intended to  replace advice given to you by your health care provider. Make sure you discuss any questions you have with your health care provider. Document Revised: 06/19/2018 Document Reviewed: 04/06/2018 Elsevier Patient Education  2020 [employer] Thoracic Strain A thoracic strain, which is sometimes called a mid-back strain, is an injury to the muscles or tendons that attach to the upper part of your back behind your chest. This type of injury occurs when a muscle is overstretched or overloaded. Thoracic strains can range from mild to severe. Mild strains may involve stretching a muscle or tendon without tearing it. These injuries may heal in 1-2 weeks. More severe strains involve tearing of muscle fibers or tendons. These will cause more pain and may take 6-8 weeks to heal. What are the causes? This condition may be caused by:  Trauma, such as a fall or a hit to the body.  Twisting or overstretching the back. This may result from doing activities that require a lot of energy, such as lifting heavy objects. In some cases, the cause may not be known. What increases the risk? This injury is more common in:  Athletes.  People with obesity. What are the signs or symptoms? The main symptom of this condition is pain in the middle back, especially with movement. Other symptoms include:  Stiffness or limited range of motion.  Sudden muscle tightening (spasms). How is this diagnosed? This condition may be diagnosed based on:  Your symptoms.  Your medical history.  A physical exam.  Imaging tests, such as X-rays or an MRI. How is this treated? This condition may be treated with:  Resting the injured area.  Applying heat and cold to the injured area.  Over-the-counter medicines for pain and inflammation, such as NSAIDs.  Prescription pain medicine or muscle relaxants may be needed for a short time.  Physical therapy. This will involve doing stretching and strengthening  exercises. Follow these instructions at home: Managing pain, stiffness, and swelling      If  directed, put ice on the injured area. ? Put ice in a plastic bag. ? Place a towel between your skin and the bag. ? Leave the ice on for 20 minutes, 2-3 times a day.  If directed, apply heat to the affected area as often as told by your health care provider. Use the heat source that your health care provider recommends, such as a moist heat pack or a heating pad. ? Place a towel between your skin and the heat source. ? Leave the heat on for 20-30 minutes. ? Remove the heat if your skin turns bright red. This is especially important if you are unable to feel pain, heat, or cold. You may have a greater risk of getting burned. Activity  Rest and return to your normal activities as told by your health care provider. Ask your health care provider what activities are safe for you.  Do exercises as told by your health care provider. Medicines  Take over-the-counter and prescription medicines only as told by your health care provider.  Ask your health care provider if the medicine prescribed to you: ? Requires you to avoid driving or using heavy machinery. ? Can cause constipation. You may need to take these actions to prevent or treat constipation:  Drink enough fluid to keep your urine pale yellow.  Take over-the-counter or prescription medicines.  Eat foods that are high in fiber, such as beans, whole grains, and fresh fruits and vegetables.  Limit foods that are high in fat and processed sugars, such as fried or sweet foods. Injury prevention To prevent a future mid-back injury:  Always warm up properly before physical activity or sports.  Cool down and stretch after being active.  Use correct form when playing sports and lifting heavy objects. Bend your knees before you lift heavy objects.  Use good posture when sitting and standing.  Stay physically fit and maintain a healthy  weight. ? Do at least 150 minutes of moderate-intensity exercise each week, such as brisk walking or water aerobics. ? Do strength exercises at least 2 times each week.  General instructions  Do not use any products that contain nicotine or tobacco, such as cigarettes, e-cigarettes, and chewing tobacco. If you need help quitting, ask your health care provider.  Keep all follow-up visits as told by your health care provider. This is important. Contact a health care provider if:  Your pain is not helped by medicine.  Your pain or stiffness is getting worse.  You develop pain or stiffness in your neck or lower back. Get help right away if you:  Have shortness of breath.  Have chest pain.  Develop numbness or weakness in your legs or arms.  Have involuntary loss of urine (urinary incontinence). Summary  A thoracic strain, which is sometimes called a mid-back strain, is an injury to the muscles or tendons that attach to the upper part of your back behind your chest.  This type of injury occurs when a muscle is overstretched or overloaded.  Rest and return to your normal activities as told by your health care provider. If directed, apply heat or ice to the affected area as often as told by your health care provider.  Take over-the-counter and prescription medicines only as told by your health care provider.  Contact a health care provider if you have new or worsening symptoms. This information is not intended to replace advice given to you by your health care provider. Make sure you discuss any questions you  have with your health care provider. Document Revised: 01/13/2018 Document Reviewed: 01/13/2018 Elsevier Patient Education  2020 [employer].

## 2019-03-18 NOTE — Progress Notes (Signed)
Subjective:    Patient ID: Lucas Barrett, male    DOB: 1986/03/08, 34 y.o.   MRN: [id number]  33y/o Caucasian established male pt c/o R mid back pain just below R scapula. Unsure what movement or injury may have caused pain.  He noticed after lifting weights in the gym about 4 days ago he alternates legs/back/shoulders. Started progressively a few days ago, worsened last night and this morning. Can feel spasms and burning sensation.  Has noticed fingertips right hand cooler than left hand but hand strength equal bilaterally. Same area has flared previously. Typically resolves with OTC pain reliever and rest. Has been using Ibuprofen, IcyHot and Biofreeze at home. New this time is numbness and tingling to R arm, worse in R hand. Gets worse when sitting at workstation and resting arm on desk and typing. Tried thermacare patch last night without relief. Has alternated between IcyHot and Biofreeze this morning without relief. Took 1000mg  Ibuprofen just prior to arrival to clinic (~1015). Declines thermacare patch in clinic. Given 1000mg  Tylenol from clinic otc stock.   Right hand dominant.  Denied weakness, loss of bowel/bladder control, or saddle paresthesias.  Per paper chart review at [facility] last NSAID/cyclobenzaprine Rx 2013 and 2016 for same affected area scapular.  Patient has noticed that using mouse/keeping right arm outstretched worsens back pain.     Review of Systems  Constitutional: Negative for activity change, appetite change, chills, diaphoresis, fatigue and fever.  HENT: Negative for trouble swallowing and voice change.   Eyes: Negative for photophobia, pain, discharge, redness, itching and visual disturbance.  Respiratory: Negative for cough, shortness of breath, wheezing and stridor.   Cardiovascular: Negative for chest pain and palpitations.  Gastrointestinal: Negative for abdominal pain, diarrhea, nausea and vomiting.  Endocrine: Negative for cold intolerance and heat  intolerance.  Genitourinary: Negative for difficulty urinating and enuresis.  Musculoskeletal: Positive for back pain. Negative for arthralgias, gait problem, joint swelling, myalgias, neck pain and neck stiffness.  Skin: Negative for color change, pallor, rash and wound.  Allergic/Immunologic: Negative for food allergies.  Neurological: Positive for numbness. Negative for dizziness, tremors, seizures, syncope, facial asymmetry, speech difficulty, weakness, light-headedness and headaches.  Hematological: Negative for adenopathy. Does not bruise/bleed easily.  Psychiatric/Behavioral: Negative for agitation, confusion and sleep disturbance.       Objective:   Physical Exam Vitals and nursing note reviewed.  Constitutional:      General: He is awake. He is not in acute distress.    Appearance: Normal appearance. He is well-developed and well-groomed. He is not ill-appearing, toxic-appearing or diaphoretic.  HENT:     Head: Normocephalic and atraumatic.     Jaw: There is normal jaw occlusion.     Salivary Glands: Right salivary gland is not diffusely enlarged or tender. Left salivary gland is not diffusely enlarged or tender.     Right Ear: Hearing and external ear normal.     Left Ear: Hearing and external ear normal.     Nose: Nose normal.     Mouth/Throat:     Mouth: Mucous membranes are moist.     Pharynx: Oropharynx is clear. Uvula midline.  Eyes:     General: Lids are normal. Vision grossly intact. Gaze aligned appropriately. No allergic shiner, visual field deficit or scleral icterus.       Right eye: No discharge.        Left eye: No discharge.     Extraocular Movements: Extraocular movements intact.     Conjunctiva/sclera: Conjunctivae  normal.     Pupils: Pupils are equal, round, and reactive to light.  Neck:     Thyroid: No thyromegaly.     Trachea: Trachea and phonation normal.  Cardiovascular:     Rate and Rhythm: Normal rate and regular rhythm.     Pulses: Normal  pulses.          Radial pulses are 2+ on the right side and 2+ on the left side.     Heart sounds: Normal heart sounds. No murmur. No friction rub. No gallop.      Comments: Capillary refill less than 2 seconds bilateral hands/fingers; right fingers distal cooler but pink/warm than left hand; full arom fingers/wrist/elbow without pain bilaterally Pulmonary:     Effort: Pulmonary effort is normal. No respiratory distress.     Breath sounds: Normal breath sounds and air entry. No stridor, decreased air movement or transmitted upper airway sounds. No decreased breath sounds, wheezing, rhonchi or rales.     Comments: Patient wearing cloth mask due to covid 19 pandemic; spoke full sentences without difficulty; no cough observed in clinic Abdominal:     General: Abdomen is flat. There is no distension.     Palpations: Abdomen is soft.  Musculoskeletal:        General: Tenderness present. No deformity.     Right shoulder: Tenderness present. No swelling, deformity, effusion, laceration, bony tenderness or crepitus. Decreased range of motion. Normal strength.     Left shoulder: Normal. No swelling, deformity, effusion, laceration, tenderness, bony tenderness or crepitus. Normal range of motion. Normal strength.     Right upper arm: Normal. No swelling, edema, deformity, lacerations, tenderness or bony tenderness.     Left upper arm: Normal.     Right elbow: Normal.     Left elbow: Normal.     Right forearm: Normal.     Left forearm: Normal.     Right wrist: Normal.     Left wrist: Normal.     Right hand: Normal.     Left hand: Normal.     Cervical back: Normal, normal range of motion and neck supple. No swelling, edema, deformity, erythema, signs of trauma, lacerations, rigidity, spasms, torticollis, tenderness, bony tenderness or crepitus. No pain with movement, spinous process tenderness or muscular tenderness. Normal range of motion.     Thoracic back: Spasms and tenderness present. No swelling,  edema, deformity, signs of trauma, lacerations or bony tenderness. Decreased range of motion. No scoliosis.     Lumbar back: Normal. No swelling, edema, deformity, signs of trauma, lacerations, spasms, tenderness or bony tenderness. Normal range of motion. No scoliosis.       Back:     Right hip: Normal.     Left hip: Normal.     Right knee: Normal.     Left knee: Normal.     Right lower leg: No edema.     Left lower leg: No edema.     Right ankle: Normal.     Left ankle: Normal.     Comments: Right scapular pain with flexion/extension/abduction/adduction worst at end AROM--equal to left but patient has to force right to match left due to pain; bilateral hand grasp equal 5/5 and upper/lower extremity strength equal 5/5; no rash/ecchymosis/TTP or swelling axillary/cervical/lumbar/arms/hands  Lymphadenopathy:     Head:     Right side of head: No submental, submandibular, tonsillar, preauricular, posterior auricular or occipital adenopathy.     Left side of head: No submental, submandibular, tonsillar, preauricular, posterior  auricular or occipital adenopathy.     Cervical: No cervical adenopathy.     Right cervical: No superficial, deep or posterior cervical adenopathy.    Left cervical: No superficial, deep or posterior cervical adenopathy.     Upper Body:     Left upper body: No axillary adenopathy.  Skin:    General: Skin is warm and dry.     Capillary Refill: Capillary refill takes less than 2 seconds.     Coloration: Skin is not ashen, cyanotic, jaundiced, mottled, pale or sallow.     Findings: Abrasion, erythema and rash present. No abscess, acne, bruising, burn, ecchymosis, laceration, lesion, petechiae or wound. Rash is macular. Rash is not crusting, nodular, papular, purpuric, pustular, scaling, urticarial or vesicular.     Nails: There is no clubbing.       Neurological:     General: No focal deficit present.     Mental Status: He is alert and oriented to person, place, and  time. Mental status is at baseline. He is not disoriented.     GCS: GCS eye subscore is 4. GCS verbal subscore is 5. GCS motor subscore is 6.     Cranial Nerves: Cranial nerves are intact. No cranial nerve deficit, dysarthria or facial asymmetry.     Sensory: Sensation is intact. No sensory deficit.     Motor: Motor function is intact. No weakness, tremor, atrophy, abnormal muscle tone or seizure activity.     Coordination: Coordination is intact. Coordination normal.     Gait: Gait is intact. Gait normal.     Deep Tendon Reflexes: Reflexes normal.     Reflex Scores:      Brachioradialis reflexes are 2+ on the right side and 2+ on the left side.      Patellar reflexes are 3+ on the right side and 3+ on the left side.    Comments: Gait sure and steady hallway; on/off exam table and in/out of chair without difficulty; bilateral hand grasp equal 5/5; patellar reflexes hyperreflexive; brachioradialis normal equal  Psychiatric:        Attention and Perception: Attention and perception normal.        Mood and Affect: Mood and affect normal.        Speech: Speech normal.        Behavior: Behavior normal. Behavior is cooperative.        Thought Content: Thought content normal.        Cognition and Memory: Cognition and memory normal.        Judgment: Judgment normal.       Applied thermacare patch to right scapular region from clinic stock    Assessment & Plan:  A-right scapular strain initial visit and muscle spasms, abrasion  P-cyclobenazeprine/flexeril 10mg  sig 5-10mg  po TID prn muscle spasms #30 RF0 dispensed from [facility].  Use tylenol 1000mg  po q6h prn pain first if no relief at work with stretching/tylenol/heat/ice/biofreeze then take  Ibuprofen 800mg  po TID prn pain with food.  Discussed with patient motrin can upset stomach/cause gastritis.  Avoid alcohol intake and driving while taking cyclobenazeprine/flexeril as drowsiness common side effect.  Slow position changes as medication also  lower blood pressure.  Home stretches demonstrated to patient-e.g. Arm circles/shoulder pendulums, spiders walking up wall, chest stretches, neck AROM, chin tucks, knee to chest and rock side to side on back. Self massage or professional prn, foam roller use or tennis/racquetball.  Heat/cryotherapy 15 minutes QID prn.  Trial thermacare 1 applied and may return to  clinic tomorrow if he found helpful today.  Consider physical therapy referral if no improvement with prescribed therapy from [facility] and/or chiropractic care.  No weight lifting until symptoms improved. Ensure ergonomics correct desk at work avoid repetitive motions if possible/holding phone/laptop in hand use desk/stand and/or break up lifting items into smaller loads/weights.   Ensure mouse at edge of desk close to keyboard.  Ensure keyboard keeps elbows at 90 degrees and supports arms so he is not holding arms extended unsupported. Patient was instructed to rest, ice, and ROM exercises.  Activity as tolerated.   Follow up if symptoms persist despite plan of care in 2 weeks; sooner if worsening especially if loss of bowel/bladder control, arm/leg weakness and/or saddle paresthesias, fingers turning blue or weak hand grasp follow up same day with a provider.  Discussed with patient muscle spasm could be compressing nerve/circulation bundle to arm or possibly thoracic herniated disk in addition to muscle strain.  Exitcare handout on muscle spasms and thoracic strain/ rehab exercises printed and given to patient.  Patient verbalized agreement and understanding of treatment plan and had no further questions at this time.  P2:  Injury Prevention and Fitness.  Avoid scratching back as could lead to secondary infection.  Use emollient/lotion daily until healed.  Follow up if signs of infection e.g. purulent discharge/swelling/wound for re-evaluation.  Patient verbalized understanding information/instructions, agreed with plan of care and had no further questions  at this time.

## 2019-03-30 ENCOUNTER — Telehealth: Payer: Self-pay | Admitting: Registered Nurse

## 2019-03-30 ENCOUNTER — Encounter: Payer: Self-pay | Admitting: Registered Nurse

## 2019-03-30 DIAGNOSIS — R12 Heartburn: Secondary | ICD-10-CM

## 2019-03-30 NOTE — Telephone Encounter (Signed)
Patient was seen 03/18/2019 for scapular strain right and nsaids recommended.  Patient with history heartburn so renewed omeprazole dr 40mg  po daily #90 RF0 but patient did not need new bottle at that time.  Dispensed 90 pills from [facility] today to patient from [facility] formulary.  See paper chart onsite.  Discussed with patient while on nsaids to ensure taking omeprazole daily as nsaid gastritis common and to have snack/meal with motrin.  Patient verbalized understanding information/instructions, agreed with plan of care and had no further questions at this time.

## 2019-04-05 ENCOUNTER — Telehealth: Payer: Self-pay | Admitting: *Deleted

## 2019-04-05 NOTE — Telephone Encounter (Signed)
Pt received positive result from [facility] rapid testing.   Advised pt to follow 10 day symptomatic person quarantine per CDC recommendations. Advised pt that Day 1 of quarantine is 04/03/19. If sx improving and no fever in previous >24hrs without antipyretics, pt to complete quarantine 04/12/19 and return to work 04/13/19. Will plan to f/u with pt on 2/1 prior to his RTW.   Reviewed possible Covid sx including cough, ShOB, sinusitis sx, sore throat, fever/chills, body aches, fatigue, loss of taste/smell, GI sx n/v/d. Also reviewed same day/emergent eval/ER precautions of dizziness/syncope, confusion, blue tint to lips/face, severe ShOB/difficulty breathing.    Pt verbalizes understanding and agreement with plan of care. No further questions/concerns at this time. Pt reminded to contact clinic with any changes in sx or questions/concerns.

## 2019-04-05 NOTE — Telephone Encounter (Signed)
RN notified by HR manager of pt's report of loss of taste and smell over the weekend. Pt was scheduled to have rapid test performed at [facility] this afternoon. Can view in [facility] that pt was given results but cannot view actual result.  RN spoke with pt over phone and he reports [facility] is [facility] and is awaiting test results. Staff told him they are running behind on testing and delivering results. Spouse is with him and she just went in to be tested.   Pt endorses that 1/22 Friday evening he began having a mild sore throat and body aches. By Saturday evening he realized he had no sense of taste or smell. Early Sunday morning he "was in a pool of sweat on the bathroom floor with n/v". Feels better today. Still with body aches, no taste/smell.   Believes [facility] was calling him while on phone with RN. RN calling pt back in 15 minutes for update.

## 2019-04-05 NOTE — Telephone Encounter (Signed)
Noted agreed with plan of care per CDC guidelines will follow up with patient 04/06/2019

## 2019-04-06 ENCOUNTER — Encounter: Payer: Self-pay | Admitting: *Deleted

## 2019-04-06 NOTE — Telephone Encounter (Signed)
Patient contacted via telephone at 1115.  He reported decreased appetite, fatigue, still in bed but feeling a little better than yesterday.  Has been sipping pedialyte and water.  Discussed with patient have seen other covid patients get dehydrated due to fatigue/no appetite/taste/smell and to continue sipping while awake pedialyte/gatorade/powerade/soup broth/water/ginger ale/popsicles nondairy to keep urine clear pale yellow and voiding every 3-4 hours while awake. We do not want him to become dehydrated as can lead to kidney problems/passing out due to low blood pressure. Patient denied n/v/d/dyspnea/sob/chest pain/fever or chills today.  Last vomiting greater than 24 hours previous.  Spouse with similar symptoms.  Patient continuing quarantine as instructed at home.  Not working from home today.  Discussed with patient I can be reached via [email or web address] or [email or web address] if questions when clinic closed.  RN Rolly Salter and I return to clinic on 04/08/2019 at 0830 and extension 2044 and will contact him again for symptom update and to see if he has further questions.  Patient verbalized understanding information/instructions, agreed with plan of care and had no further questions at this time.

## 2019-04-08 NOTE — Telephone Encounter (Signed)
Telephone message left for patient stating I was calling to see if he had any questions or concerns today and hopefully his decreased appetite had improved and he was able to stay hydrated.  [facility] and positive covid test results 04/05/2019  Notified patient he could contact me via [email or web address] or [email or web address] and that I would be checking computer for the next hour then would be logging off for the night.

## 2019-04-09 NOTE — Telephone Encounter (Signed)
Spoke with pt over phone. He reports that n/v resolved evening of 1/26 after he last spoke with NP. Most prominent sx now are fatigue, productive cough, and loss of taste and smell. He reports frequent napping and sleeping in/going to bed earlier. Sts he noticed a decrease in stamina and increase in cough even just pulling trash can from road to house yesterday which he normally has no problem doing.   Sts he has been able to tolerate solid foods and liquids, though he has been mostly pushing pedialyte and water. Reports decreased appetite and lack of taste make it difficult for him to want to eat but he tries small snacks when he has the energy. Reminded pt to keep hydrated enough to keep urine clear to pale yellow.  Sts spouse has similar sx except more sinus congestion and dulled sense of taste related to sinusitis. They are quarantining at home together. No one else in household.   Confirmed with pt that he is to complete quarantine on 2/1 and can RTW on-site on 2/2. If he feels unable to RTW due to sx, he should f/u with pcp for work note and if longer than 3 days out of work after quarantine is anticipated, speak with HR regarding FMLA paperwork. Plan to f/u with pt again on 2/1 prior to his return. Pt verbalizes understanding and agreement with plan of care. No further questions/concerns.

## 2019-04-11 NOTE — Telephone Encounter (Signed)
Noted patient still fatigued and no appetite.  Attempted to reach patient via telephone today went straight to voicemail left message to continue fluids and trying to eat snacks to avoid dehydration.  To contact HR/PCM if he does not feel he will be able to return to work on 2 Feb due to symptoms.  Last day of scheduled quarantine 04/12/2019 until symptoms not improving/worsening.  Will need to verify with patient symptoms improving and no fever/chills in previous 24 hours.  Patient notified he can reach me via [email or web address] or [email or web address] if further questions or concerns when clinic closed.

## 2019-04-12 NOTE — Telephone Encounter (Signed)
Noted case discussed via telephone with RN Rolly Salter earlier today as I am working offsite.  Patient emailed this evening to say his appt 1115 13 Apr 2019 with [facility] and that I should notify HR Tim also which I did.

## 2019-04-12 NOTE — Telephone Encounter (Signed)
Spoke with pt over phone. Still feels fatigued and ShOB with activity, from showering/dressing to loading things in vehicle. Appetite has returned, but still no taste or smell. Productive cough at night, better during the day. Taking Mucinex qhs. Intermittent nausea which is new for him and spouse, that only occurs in evenings and nighttime. Only occurs for about an hour. Does not feel ready to return to work due to continued sx. Reached primary care over the weekend to discuss. They agreed to provide him with a work note. He is waiting to hear back about an appt time for a virtual visit today/tomorrow. Reminded pt to contact HR if he will be out of work  longer than 3 days for [employer] paperwork. Emailed [employer] Post Covid-19 Patient Info Packet to pt. He will contact RN or NP by phone or email once he has tentative RTW date. No further questions/concerns.

## 2019-04-12 NOTE — Telephone Encounter (Signed)
Pt emailed an updates stating that he has a virtual visit with pcp 2/2 at 1115. Will f/u with clinic once work note and other recommendations are obtained.

## 2019-04-15 NOTE — Telephone Encounter (Signed)
Telephone message left for patient calling to check in on him and follow up to see what was determined with his telehealth [facility] visit as unable to see any notes in Epic.  Noted he can email me at [email or web address] or send my chart message also.

## 2019-04-18 NOTE — Telephone Encounter (Signed)
Telephone message left for patient calling to check in and see if symptoms improved.  Discussed clinic closed tomorrow RN Rolly Salter on vacation and clinic will reopen Tuesday.  If patient having n/v/d/fever/chills he should stay home until any of these symptoms resolved 24 hours.  I can be reached via email [email or web address] or [email or web address] if further questions or concerns prior to clinic reopening.

## 2019-04-19 NOTE — Telephone Encounter (Signed)
Patient reported he felt good yesterday returned to work for full shift today.  Was very fatigued by 2:30pm.  Tolerated po intake without difficulty but still decreased appetite.  Discussed pacing himself, ensuring keeping hydrated and eating regular meals, taking nap if needed, planning 7-8 hours of sleep at night.  Discussed with patient he may have good and bad days regarding stamina/fatigue as he recovers from covid infection. Patient has [employer] taking care of yourself post covid infection handout.   Patient may stop into clinic to check VS/BS when at work if not feeling his usual to include sp02.  Discussed Rolly Salter and I in clinic Tu/Th this week.  [facility] only Friday and closed Wednesday.  Patient verbalized understanding information/instructions, agreed with plan of care and had no further questions at this time.  No cough heard during 2 minute telephone call.  Normal respiratory effort/rate, strong voice, A&Ox3.

## 2019-04-20 NOTE — Telephone Encounter (Signed)
RN and PA were informed by HR manager that pt returned to work yesterday and was on site today but he was leaving to go home as he felt too tired to complete full job duties. Manager reports pt is planning on f/u with pcp regarding extended time out and/or FMLA for post-covid recovery.

## 2019-04-20 NOTE — Telephone Encounter (Signed)
Noted  

## 2019-04-22 NOTE — Telephone Encounter (Signed)
Received PCM note from patient via email dated 21 Apr 2019 shortened work hours to leave at 1330 from 2/8-2/02/2020.  Note printed and given to HR Tim.  Patient notified.

## 2019-04-25 NOTE — Telephone Encounter (Signed)
Telephone message left for patient that I was calling to see how curtailed work hours went for him last week since I didn't see him onsite.  I hope he was able to rest and recuperate this wet and rainy weekend.  RN Rolly Salter to be in clinic tomorrow.  I can be reached via mychart or [email or web address] if further questions or concerns today.

## 2019-04-26 NOTE — Telephone Encounter (Signed)
Pt at work today. Reports last week's reduced hours worked well for him. He felt better in the evenings when he got home early and was able to keep a more regular routine. Sts the energy level has not improved much since last week. Denies any other sx. Pt wants to try full time hours today as scheduled, but is still feeling tired (around noon). Sts his pcp told him to let her know if he needs additional time on reduced hours and pt plans to make a decision on contacting her after end of workday today. Sts he will inform clinic if new work note is requested/received. He has no further questions/concerns at this time.

## 2019-04-26 NOTE — Telephone Encounter (Signed)
Noted continued afternoon fatigue and patient trialing full time hours but will contact PCM if fatigue worsening.

## 2019-04-26 NOTE — Telephone Encounter (Signed)
Patient no answer this weekend.  Curtained work hours expired Friday 04/23/2019 from [facility].  Please check in with him today.

## 2019-04-27 NOTE — Telephone Encounter (Signed)
Received email from patient today [phone] "So Im doing the short work week this week too.  I tried to do a full day yesterday and only made it to 2.  I haven't been that tired as I was yesterday in a few weeks.  Still no body ahces or fever.  Throat is a little sore and my ear has been in pain for the last two days but more than likely from head being stopped up and allergies.  I'll get my doctor to send another note I guess.  I emailed her about it"  RN Rolly Salter replied to patient that we received his message and he should notify HR/supervisor also.

## 2019-05-01 NOTE — Telephone Encounter (Signed)
Patient emailed me work note from [employer] provider for curtained work hours starting 04/27/2019 ending 05/01/2019.  Doctor note forwarded to HR Tim and [employer]  See paper record at [facility] on file.  Patient still having fatigue/decreased endurance post covid infection

## 2019-05-03 NOTE — Telephone Encounter (Signed)
noted 

## 2019-05-03 NOTE — Telephone Encounter (Signed)
Noted. Spoke with pt over phone. He reports feeling well overall, improved from last week. Fatigue still present. He is working half day today as he and spouse had baby shower yesterday and this depleted his energy. He is planning to try to work full day tomorrow. If unable to tolerate, he will obtain additional work note from pcp for extended reduced working hours. Sts he is "taking it week by week" to see how he feels and what he can tolerate. Sts pcp is agreeable with this and will provide work notes as needed based on pt's recovery. He denies any questions/concerns.

## 2019-05-04 ENCOUNTER — Telehealth: Payer: Self-pay | Admitting: *Deleted

## 2019-05-04 NOTE — Telephone Encounter (Signed)
Pt in to clinic requesting advice regarding post-Covid fatigue. Dx'd one month ago now. Fatigue has been the persistent sx. He also lost taste and smell, but taste has returned about 50% and smell about 30%. This has decreased his appetite as well but the fatigue is affecting him the most. His wife is pregnant, due in 4 weeks, and he wants to be back to his regular activity levels and enduanrce prior to this.   Advised pt that fatigue has been a persistent sx for many people post-covid and some have experienced it for only a few days and others for 4-6 months. He currently is taking a 1 hour nap when he gets home from work. Working short days, leaving around 1330 each day. Then sleeping nightly 6-7 hours. Sts he has never slept well and he is sleeping more right now but still not restfully. Still feels tired/groggy in morning. Gets up to use restroom sometimes once per night. Endorses difficulty going to sleep and staying asleep even with him feeling very tired right now. He is currently taking 10mg  Melatonin before bed. Advised him to try to reduce to 5mg  to still help him to fall asleep but maybe help avoid groggy feeling in the AM. Also suggested avoiding blue light/screens for at least 1 hr prior to bed. Also can try light exercise, such as a short walk before bed to try, or otherwise based on his endurance to try to help him fall asleep easier. Try journaling or reading for 30 minutes or until he starts feeling more tired if he can't get to sleep. NP Betancourt also advised him to shorten the length of his afternoon naps to no more than 40 minutes to help him sleep better/longer at night. Also reports that most recent studies are not showing any efficacy in the setting of Covid-19 for Zinc or Vitamin C shortening the duration or lessening the severity of sx as they can other viruses. Also reminded pt of ACERS handout he received from NP as it reviews management of post-Covid fatigue. Reviewed the 4P's per  handout and encouraged pt to follow these to reduce his energy use throughout the day in addition to good sleep hygiene and reducing nap length. He verbalizes understanding and agreement with suggestions.  Denies any further questions/concerns.

## 2019-05-05 NOTE — Telephone Encounter (Signed)
Reviewed RN Rolly Salter note agreed with plan of care and patient to follow up with PCM if further concerns or requiring work restrictions.

## 2019-05-06 NOTE — Telephone Encounter (Signed)
Received email from patient with [facility] note  stating he was seen for [facility] telehealth visit on 05/05/2019 and to continue reduced work hours leaving 1330 05/03/2019-05/07/2019 printed and HR Tim given copy and another placed in [facility] paper chart.

## 2019-05-10 NOTE — Telephone Encounter (Signed)
Please follow up with patient today in afternoon to see if less fatigue or if he will be contacting PCM again for restricted work hours

## 2019-05-10 NOTE — Telephone Encounter (Signed)
Noted see second tcon

## 2019-05-10 NOTE — Telephone Encounter (Signed)
Pt appears to have left work for the day already. Not available by phone. Awaiting HR response by email to confirm last date of approved half-days per pcp provided work note. Will f/u tomorrow.

## 2019-05-10 NOTE — Telephone Encounter (Signed)
See 2/23 RN encounter for updated note.

## 2019-05-10 NOTE — Telephone Encounter (Signed)
Noted patient left work early today

## 2019-05-10 NOTE — Telephone Encounter (Signed)
Please follow up with patient this afternoon to see if fatigue continuing and if he is contacting PCM again for curtained work hours note.

## 2019-05-11 ENCOUNTER — Encounter: Payer: Self-pay | Admitting: *Deleted

## 2019-05-11 NOTE — Telephone Encounter (Signed)
Patient reported he had to leave work early yesterday for other reasons.  Feeling well today expecting to work full shift.  Patient seen at his workcenter this morning.

## 2019-07-06 ENCOUNTER — Telehealth: Payer: Self-pay | Admitting: Registered Nurse

## 2019-07-06 ENCOUNTER — Encounter: Payer: Self-pay | Admitting: Registered Nurse

## 2019-07-06 DIAGNOSIS — R12 Heartburn: Secondary | ICD-10-CM | POA: Insufficient documentation

## 2019-07-06 MED ORDER — OMEPRAZOLE 40 MG PO CPDR: 40.0000 mg | DELAYED_RELEASE_CAPSULE | Freq: Every day | ORAL | 0 refills | Status: DC

## 2019-07-06 MED ORDER — SALINE SPRAY 0.65 % NA SOLN: 2.0000 | NASAL | 0 refills | Status: DC

## 2019-07-06 NOTE — Telephone Encounter (Signed)
Patient requested refill omeprazole DR 40mg  po daily #90 RF0 dispensed from [facility] to patient today.  Still having stress and dose working well for him to manage heartburn  Last labs 07/18/2018.  Fasting exec panel plus magnesium due to chronic PPI use due May 2021 please schedule with patient. My chart message sent to patient.

## 2019-08-02 NOTE — Telephone Encounter (Signed)
Pt scheduled for fasting labs, exec panel, A1c, and Magnesium on Fri 08/06/19 at 0915.

## 2019-08-02 NOTE — Telephone Encounter (Signed)
Noted patient labs scheduled 08/06/2019

## 2019-08-05 ENCOUNTER — Encounter: Payer: Self-pay | Admitting: Registered Nurse

## 2019-08-05 ENCOUNTER — Other Ambulatory Visit: Payer: Self-pay

## 2019-08-05 ENCOUNTER — Ambulatory Visit: Payer: Self-pay | Admitting: Registered Nurse

## 2019-08-05 VITALS — HR 76 | Resp 16

## 2019-08-05 DIAGNOSIS — L309 Dermatitis, unspecified: Secondary | ICD-10-CM

## 2019-08-05 MED ORDER — MUPIROCIN 2 % EX OINT: 1.0000 "application " | TOPICAL_OINTMENT | Freq: Two times a day (BID) | CUTANEOUS | 0 refills | Status: DC

## 2019-08-05 NOTE — Progress Notes (Signed)
Subjective:    Patient ID: Lucas Barrett, male    DOB: 1985-07-12, 34 y.o.   MRN: [id number]  33y/o caucasian married male established patient has seen dermatology for rash on head and chest after shaving.  Typically he shaves head with razor weekly.  Was told he had folliculitis.  Here today for second opinion.  Has second opinion dermatology appt scheduled for next Friday first week of June.  Was given doxycycline 100mg  po BID x 1 month--didn't improve rash and upset his stomach after 2 weeks use.  Tried triamcinolone and clobetasol Rx at home from wife did not help either.  Reports itchy and sometimes burning in the mornings.  Has been using new tgel shampoo and typically axe soap (fragranced) on body.  New laundry detergent (dreft) used at home for infant loads only but he is holding infant frequently at home.  Works in at [employer] work.  Denied fever/chills.  Typically notices he is scratching neck evening/night.  Taking his usual zyrtec am and benadryl pm.  Typically shaves scalp and chest but has not in the past week due to rash.     Review of Systems  Constitutional: Negative for activity change, appetite change, chills, diaphoresis, fatigue, fever and unexpected weight change.  HENT: Negative for facial swelling, trouble swallowing and voice change.   Eyes: Negative for photophobia and visual disturbance.  Respiratory: Negative for cough, choking, chest tightness, shortness of breath, wheezing and stridor.   Gastrointestinal: Negative for blood in stool, constipation and vomiting.  Endocrine: Negative for cold intolerance and heat intolerance.  Genitourinary: Negative for difficulty urinating.  Musculoskeletal: Negative for neck pain and neck stiffness.  Skin: Positive for color change and rash. Negative for pallor and wound.  Allergic/Immunologic: Positive for environmental allergies. Negative for food allergies.  Neurological: Negative for  dizziness, tremors, seizures, syncope, speech difficulty, weakness, light-headedness, numbness and headaches.  Hematological: Negative for adenopathy. Does not bruise/bleed easily.  Psychiatric/Behavioral: Negative for agitation and sleep disturbance.       Objective:   Physical Exam Constitutional:      General: He is awake. He is not in acute distress.    Appearance: Normal appearance. He is well-developed, well-groomed and normal weight. He is not ill-appearing, toxic-appearing or diaphoretic.  HENT:     Head: Normocephalic and atraumatic.     Jaw: There is normal jaw occlusion.     Salivary Glands: Right salivary gland is not diffusely enlarged or tender. Left salivary gland is not diffusely enlarged or tender.     Right Ear: Hearing and external ear normal.     Left Ear: Hearing and external ear normal.     Nose: Nose normal.     Mouth/Throat:     Pharynx: Oropharynx is clear.  Eyes:     General: Lids are normal. Vision grossly intact. Gaze aligned appropriately. Allergic shiner present. No visual field deficit or scleral icterus.       Right eye: No discharge.        Left eye: No discharge.     Extraocular Movements: Extraocular movements intact.     Conjunctiva/sclera: Conjunctivae normal.     Pupils: Pupils are equal, round, and reactive to light.  Neck:     Thyroid: No thyromegaly.     Trachea: Trachea and phonation normal.  Cardiovascular:     Rate and Rhythm: Normal rate and regular rhythm.     Pulses: Normal pulses.  Radial pulses are 2+ on the right side and 2+ on the left side.  Pulmonary:     Effort: Pulmonary effort is normal. No respiratory distress.     Breath sounds: Normal breath sounds and air entry. No stridor, decreased air movement or transmitted upper airway sounds. No decreased breath sounds, wheezing, rhonchi or rales.     Comments: Wearing cloth mask due to covid 19 pandemic; spoke full sentences without difficulty; no cough observed in  clinic Abdominal:     General: Abdomen is flat.     Palpations: Abdomen is soft.  Musculoskeletal:        General: No swelling, tenderness, deformity or signs of injury. Normal range of motion.     Right shoulder: Normal.     Left shoulder: Normal.     Right elbow: Normal.     Left elbow: Normal.     Right hand: Normal.     Left hand: Normal.     Cervical back: Normal, normal range of motion and neck supple. No swelling, edema, deformity, erythema, signs of trauma, rigidity, spasms, torticollis, tenderness or crepitus. No pain with movement or muscular tenderness. Normal range of motion.     Thoracic back: Normal.     Lumbar back: Normal.     Right hip: Normal.     Left hip: Normal.     Right knee: Normal.     Left knee: Normal.     Right lower leg: No edema.     Left lower leg: No edema.  Lymphadenopathy:     Head:     Right side of head: No submental, submandibular, tonsillar, preauricular, posterior auricular or occipital adenopathy.     Left side of head: No submental, submandibular, tonsillar, preauricular, posterior auricular or occipital adenopathy.     Cervical: No cervical adenopathy.     Right cervical: No superficial cervical adenopathy.    Left cervical: No superficial cervical adenopathy.  Skin:    General: Skin is warm and dry.     Capillary Refill: Capillary refill takes less than 2 seconds.     Coloration: Skin is not ashen, cyanotic, jaundiced, mottled, pale or sallow.     Findings: Abrasion, erythema and rash present. No abscess, acne, bruising, burn, ecchymosis, signs of injury, laceration, lesion, petechiae or wound. Rash is macular and papular. Rash is not crusting, nodular, purpuric, pustular, scaling, urticarial or vesicular.     Nails: There is no clubbing.          Comments: Scattered dry papules with tips abraded brown scab largest number grouped on neck at collar line and at hat band area around scalp; 25% with erythema 1-65mm past diameter of papule;  patient wearing ball cap removed for exam; scalp hair grown out approximately 1 inch sparse; anterior chest with scattered fewer than neck dry abraded papules and 1/3 inch hair stubble  Neurological:     General: No focal deficit present.     Mental Status: He is alert and oriented to person, place, and time. Mental status is at baseline.     GCS: GCS eye subscore is 4. GCS verbal subscore is 5. GCS motor subscore is 6.     Cranial Nerves: Cranial nerves are intact. No cranial nerve deficit, dysarthria or facial asymmetry.     Sensory: Sensation is intact. No sensory deficit.     Motor: Motor function is intact. No weakness, tremor, atrophy, abnormal muscle tone or seizure activity.     Coordination: Coordination is intact.  Coordination normal.     Gait: Gait is intact. Gait normal.  Psychiatric:        Attention and Perception: Attention and perception normal.        Mood and Affect: Mood and affect normal.        Speech: Speech normal.        Behavior: Behavior normal. Behavior is cooperative.        Thought Content: Thought content normal.        Cognition and Memory: Cognition and memory normal.        Judgment: Judgment normal.           Assessment & Plan:  A-dermatitis  P-my initial evaluation of patient for this condition.  Suspect folliculitis initially now possible MRSA since doxycycline/clobetasol/triamcinolone did not help.  Due to history of GERD trial of topical mupirocin 2% BID x 2 weeks.  Change soap to one for sensitive skin without fragrance, stop tgel on scalp.  Avoid hot steamy showers.  Patient also reported has tried calagel and a liquid from dermatologist that was supposed to dry out lesions.  Burning and itching bothering him the most and inability to shave for fear of worsening rash.  Patient history of working out in gym but not as much due to covid pandemic gym closures.  Work has used different chemicals and cleaning regimen during pandemic could have contact  dermatitis as differential diagnosis (hand sanitizers many brands/suppliers in use throughout worksite/businesses in local area).  Exitcare handouts printed and given on MRSA and dermatitis to patient.  Will follow up next Tuesday for re-evaluation and electronic Rx sent to his pharmacy of choice for 22g mupirocin and due to Epic down at time of appt Rx was called in then electronically sent at approximately 1400 when [facility] back online.  Patient given AVS after 1400 walked to his desk. Avoid shaving until rash healed. May continue zyrtec 10mg  po BID prn itching or zyrtec 10mg  po qam and benadryl 25-50mg  po qhs prn itching.  Discussed use ice instead of itching/scratching also to prevent secondary infections.  Tolerating benadryl/zyrtec without difficulty  Consider apply emollient twice a day e.g. Fragrance free vaseline.  May apply ice/cold compress 5 minutes QID prn itching/swelling.  Shower when he finishes work/prior to bedtime.  Avoid harsh/abrasive soaps use fragrance free/sensitive like dove/cetaphil/neutragena sensitive.    Medication as directed. Call or return to clinic as needed if these symptoms worsen or fail to improve as anticipated. Patient verbalized agreement and understanding of treatment plan and had no further questions at this time

## 2019-08-05 NOTE — Patient Instructions (Signed)
MRSA Infection, Diagnosis, Adult Methicillin-resistant Staphylococcus aureus (MRSA) infection is caused by bacteria called Staphylococcus aureus, or staph, that no longer respond to common antibiotic medicines (drug-resistant bacteria). MRSA infection can be hard to treat. Most of the time, MRSA can be on the skin or in the nose without causing problems (colonized). However, if MRSA enters the body through a cut, a sore, or an invasive medical device, it can cause a serious infection. What are the causes? This condition is caused by staph bacteria. Illness may develop after exposure to the bacteria through:  Skin-to-skin contact with someone who is infected with MRSA.  Touching surfaces that have the bacteria on them.  Having a procedure or using equipment that allows MRSA to enter the body.  Having MRSA that lives on your skin and then enters your body through: ? A cut or scratch. ? A surgery or procedure. ? The use of a medical device. Contact with the bacteria may occur:  During a stay in a hospital, rehabilitation facility, nursing home, or other health care facility (health care-associated MRSA).  In daily activities where there is close contact with others, such as sports, child care centers, or at home (community-associated MRSA). What increases the risk? You are more likely to develop this condition if you:  Have a surgery or procedure.  Have an IV or a thin tube (catheter) placed in your body.  Are elderly.  Are on kidney dialysis.  Have recently taken an antibiotic medicine.  Live in a long-term care facility.  Have a chronic wound or skin ulcer.  Have a weak body defense system (immune system).  Play sports that involve skin-to-skin contact.  Live in a crowded place, like a dormitory or military barracks.  Share towels, razors, or sports equipment with other people.  Have a history of MRSA infection or colonization. What are the signs or symptoms? Symptoms  of this condition depend on the area that is affected. Symptoms may include:  A pus-filled pimple or boil.  Pus that drains from your skin.  A sore (abscess) under your skin or somewhere in your body.  Fever with or without chills.  Difficulty breathing.  Coughing up blood.  Redness, warmth, swelling, or pain in the affected area. How is this diagnosed? This condition may be diagnosed based on:  A physical exam.  Your medical history.  Taking a sample from the infected area and growing it in a lab (culture). You may also have other tests, including:  Imaging tests, such as X-rays, a CT scan, or an MRI.  Lab tests, such as blood, urine, or phlegm (sputum) tests. You skin or nose may be swabbed when you are admitted to a health care facility for a procedure. This is to screen for MRSA. How is this treated? Treatment depends on the type of MRSA infection you have and how severe, deep, or extensive it is. Treatment may include:  Antibiotic medicines.  Surgery to drain pus from the infected area. Severe infections may require a hospital stay. Follow these instructions at home: Medicines  Take over-the-counter and prescription medicines only as told by your health care provider.  If you were prescribed an antibiotic medicine, use it as told by your health care provider. Do not stop using the antibiotic even if you start to feel better. Prevention Follow these instructions to avoid spreading the infection to others:  Wash your hands frequently with soap and water. If soap and water are not available, use an alcohol-based hand sanitizer.    Avoid close contact with those around you as much as possible. Do not use towels, razors, toothbrushes, bedding, or other items that will be used by others.  Wash towels, bedding, and clothes in the washing machine with detergent and hot water. Dry them in a hot dryer.  Clean surfaces regularly to remove germs (disinfection). Use products  or solutions that contain bleach. Make sure you disinfect bathroom surfaces, food preparation areas, exercise equipment, and doorknobs.  General instructions  If you have a wound, follow instructions from your health care provider about how to take care of your wound. ? Do not pick at scabs. ? Do not try to drain any infection sites or pimples.  Tell all your health care providers that you have MRSA, or if you have ever had a MRSA infection.  Keep all follow-up visits as told by your health care provider. This is important. Contact a health care provider if you:  Do not get better.  Have symptoms that get worse.  Have new symptoms. Get help right away if you have:  Nausea or vomiting, or if you cannot take medicine without vomiting.  Trouble breathing.  Chest pain. These symptoms may represent a serious problem that is an emergency. Do not wait to see if the symptoms will go away. Get medical help right away. Call your local emergency services (911 in the [place] Do not drive yourself to the hospital. Summary  MRSA infection is caused by bacteria called Staphylococcus aureus, or staph, that no longer respond to common antibiotic medicines.  Treatment for this condition depends on the type of MRSA infection you have and how severe, deep, and extensive it is.  If you were prescribed an antibiotic medicine, use it as told by your health care provider. Do not stop using the antibiotic even if you start to feel better.  Follow instructions from your health care provider to avoid spreading the infection to others. This information is not intended to replace advice given to you by your health care provider. Make sure you discuss any questions you have with your health care provider. Document Revised: 05/14/2018 Document Reviewed: 05/15/2018 Elsevier Patient Education  2020 [employer] Rash, Adult A rash is a change in the color of your skin. A rash can also change the way your skin  feels. There are many different conditions and factors that can cause a rash. Some rashes may disappear after a few days, but some may last for a few weeks. Common causes of rashes include:  Viral infections, such as: ? Colds. ? Measles. ? Hand, foot, and mouth disease.  Bacterial infections, such as: ? Scarlet fever. ? Impetigo.  Fungal infections, such as Candida.  Allergic reactions to food, medicines, or skin care products. Follow these instructions at home: The goal of treatment is to stop the itching and keep the rash from spreading. Pay attention to any changes in your symptoms. Follow these instructions to help with your condition: Medicine Take or apply over-the-counter and prescription medicines only as told by your health care provider. These may include:  Corticosteroid creams to treat red or swollen skin.  Anti-itch lotions.  Oral allergy medicines (antihistamines).  Oral corticosteroids for severe symptoms.  Skin care  Apply cool compresses to the affected areas.  Do not scratch or rub your skin.  Avoid covering the rash. Make sure the rash is exposed to air as much as possible. Managing itching and discomfort  Avoid hot showers or baths, which can make itching worse.  A cold shower may help.  Try taking a bath with: ? Epsom salts. Follow manufacturer instructions on the packaging. You can get these at your local pharmacy or grocery store. ? Baking soda. Pour a small amount into the bath as told by your health care provider. ? Colloidal oatmeal. Follow manufacturer instructions on the packaging. You can get this at your local pharmacy or grocery store.  Try applying baking soda paste to your skin. Stir water into baking soda until it reaches a paste-like consistency.  Try applying calamine lotion. This is an over-the-counter lotion that helps to relieve itchiness.  Keep cool and out of the sun. Sweating and being hot can make itching worse. General  instructions   Rest as needed.  Drink enough fluid to keep your urine pale yellow.  Wear loose-fitting clothing.  Avoid scented soaps, detergents, and perfumes. Use gentle soaps, detergents, perfumes, and other cosmetic products.  Avoid any substance that causes your rash. Keep a journal to help track what causes your rash. Write down: ? What you eat. ? What cosmetic products you use. ? What you drink. ? What you wear. This includes jewelry.  Keep all follow-up visits as told by your health care provider. This is important. Contact a health care provider if:  You sweat at night.  You lose weight.  You urinate more than normal.  You urinate less than normal, or you notice that your urine is a darker color than usual.  You feel weak.  You vomit.  Your skin or the whites of your eyes look yellow (jaundice).  Your skin: ? Tingles. ? Is numb.  Your rash: ? Does not go away after several days. ? Gets worse.  You are: ? Unusually thirsty. ? More tired than normal.  You have: ? New symptoms. ? Pain in your abdomen. ? A fever. ? Diarrhea. Get help right away if you:  Have a fever and your symptoms suddenly get worse.  Develop confusion.  Have a severe headache or a stiff neck.  Have severe joint pains or stiffness.  Have a seizure.  Develop a rash that covers all or most of your body. The rash may or may not be painful.  Develop blisters that: ? Are on top of the rash. ? Grow larger or grow together. ? Are painful. ? Are inside your nose or mouth.  Develop a rash that: ? Looks like purple pinprick-sized spots all over your body. ? Has a "bull's eye" or looks like a target. ? Is not related to sun exposure, is red and painful, and causes your skin to peel. Summary  A rash is a change in the color of your skin. Some rashes disappear after a few days, but some may last for a few weeks.  The goal of treatment is to stop the itching and keep the rash  from spreading.  Take or apply over-the-counter and prescription medicines only as told by your health care provider.  Contact a health care provider if you have new or worsening symptoms.  Keep all follow-up visits as told by your health care provider. This is important. This information is not intended to replace advice given to you by your health care provider. Make sure you discuss any questions you have with your health care provider. Document Revised: 06/19/2018 Document Reviewed: 09/29/2017 Elsevier Patient Education  [place].

## 2019-08-10 ENCOUNTER — Other Ambulatory Visit: Payer: Self-pay

## 2019-08-10 ENCOUNTER — Encounter: Payer: Self-pay | Admitting: Registered Nurse

## 2019-08-10 ENCOUNTER — Ambulatory Visit: Payer: Self-pay | Admitting: Registered Nurse

## 2019-08-10 VITALS — BP 119/67 | HR 81 | Temp 96.6°F

## 2019-08-10 DIAGNOSIS — L299 Pruritus, unspecified: Secondary | ICD-10-CM

## 2019-08-10 NOTE — Telephone Encounter (Signed)
Patient did not come into work on 08/06/2019 and labs pending rescheduling per [employer].  Patient coming to clinic today for rash re-evaluation

## 2019-08-10 NOTE — Progress Notes (Signed)
Subjective:    Patient ID: Lucas Barrett, male    DOB: [birth year], 34 y.o.   MRN: [id number]  33y/o married caucasian male follow up rash from office visit 08/05/2019 was started on mupirocin 2% ointment and told to stop tgel shampoo and axe soap.  Patient reported itching didn't improve but rash seems to be improving especially on chest and forehead/left side scalp/occipital.  Right side scalp itching the most and seems to have the largest cluster of rash.  Denied fever/drainage/body aches/dyspnea/cough/chills.  Started to use calagel and cortisone OTC topical on itchiest areas and is continuing use of zyrtec 10mg  am and benadryl 25mg  po qhs for itching with relief.  Didn't try ice cubes this weekend.  Hasn't used clobetasol or triamcinolone this weekend.  Has not shaved head/chest/face still due to rash.  Dermatology re-evaluation still pending for this Friday and if rash continues to improve patient is going to cancel appt.     Review of Systems  Constitutional: Negative for activity change, appetite change, chills, diaphoresis, fatigue and fever.  HENT: Negative for congestion, postnasal drip, rhinorrhea, trouble swallowing and voice change.   Eyes: Negative for photophobia and visual disturbance.  Respiratory: Negative for cough, choking, chest tightness, shortness of breath, wheezing and stridor.   Cardiovascular: Negative for chest pain and palpitations.  Gastrointestinal: Negative for diarrhea, nausea and vomiting.  Endocrine: Negative for cold intolerance and heat intolerance.  Genitourinary: Negative for difficulty urinating.  Musculoskeletal: Negative for back pain, gait problem, joint swelling, myalgias, neck pain and neck stiffness.  Skin: Positive for rash. Negative for color change, pallor and wound.  Allergic/Immunologic: Positive for environmental allergies. Negative for food allergies.  Neurological: Negative for dizziness, tremors, seizures, syncope, facial asymmetry, speech  difficulty, weakness, light-headedness, numbness and headaches.  Hematological: Negative for adenopathy. Does not bruise/bleed easily.  Psychiatric/Behavioral: Negative for agitation, confusion and sleep disturbance.       Objective:   Physical Exam Vitals and nursing note reviewed.  Constitutional:      General: He is awake. He is not in acute distress.    Appearance: Normal appearance. He is well-developed and well-groomed. He is not ill-appearing, toxic-appearing or diaphoretic.  HENT:     Head: Normocephalic and atraumatic.     Jaw: There is normal jaw occlusion.     Salivary Glands: Right salivary gland is not diffusely enlarged or tender. Left salivary gland is not diffusely enlarged or tender.     Right Ear: Hearing and external ear normal.     Left Ear: Hearing and external ear normal.     Nose: Nose normal. No congestion or rhinorrhea.     Mouth/Throat:     Mouth: No angioedema.     Pharynx: Oropharynx is clear.  Eyes:     General: Lids are normal. Vision grossly intact. Gaze aligned appropriately. Allergic shiner present. No visual field deficit or scleral icterus.    Extraocular Movements: Extraocular movements intact.     Conjunctiva/sclera: Conjunctivae normal.     Pupils: Pupils are equal, round, and reactive to light.  Neck:     Thyroid: No thyromegaly.     Trachea: Trachea and phonation normal.  Cardiovascular:     Rate and Rhythm: Normal rate and regular rhythm.     Pulses: Normal pulses.          Radial pulses are 2+ on the right side and 2+ on the left side.  Pulmonary:     Effort: Pulmonary effort is normal. No respiratory distress.  Breath sounds: Normal breath sounds and air entry. No stridor, decreased air movement or transmitted upper airway sounds. No decreased breath sounds, wheezing, rhonchi or rales.     Comments: Wearing cloth mask due to covid 19 pandemic; spoke full sentences without difficulty; no cough observed in exam room Abdominal:      General: Abdomen is flat.     Palpations: Abdomen is soft.  Musculoskeletal:        General: No swelling, tenderness, deformity or signs of injury. Normal range of motion.     Right shoulder: Normal.     Left shoulder: Normal.     Right elbow: Normal.     Left elbow: Normal.     Right hand: Normal.     Left hand: Normal.     Cervical back: Normal, normal range of motion and neck supple. No swelling, edema, deformity, erythema, signs of trauma, lacerations, rigidity, torticollis, tenderness or crepitus. No pain with movement. Normal range of motion.     Thoracic back: Normal.     Lumbar back: Normal.     Right hip: Normal.     Left hip: Normal.     Right knee: Normal.     Left knee: Normal.     Right lower leg: No edema.     Left lower leg: No edema.  Lymphadenopathy:     Head:     Right side of head: No submental, submandibular, tonsillar, preauricular, posterior auricular or occipital adenopathy.     Left side of head: No submental, submandibular, tonsillar, preauricular, posterior auricular or occipital adenopathy.     Cervical: No cervical adenopathy.     Right cervical: No superficial cervical adenopathy.    Left cervical: No superficial cervical adenopathy.  Skin:    General: Skin is warm and dry.     Capillary Refill: Capillary refill takes less than 2 seconds.     Coloration: Skin is not ashen, cyanotic, jaundiced, mottled, pale or sallow.     Findings: Abrasion and rash present. No abscess, acne, bruising, burn, ecchymosis, erythema, signs of injury, laceration, lesion, petechiae or wound. Rash is papular. Rash is not crusting, macular, nodular, purpuric, pustular, scaling, urticarial or vesicular.     Nails: There is no clubbing.          Comments: Scattered papules except grouped scalp superior to right pinna in linear formation at hat line; scattered anterior chest and scalp dry decreased/resolved erythema compared to last week and scabs where present right scalp fine  brown pinpoint  Neurological:     General: No focal deficit present.     Mental Status: He is alert and oriented to person, place, and time. Mental status is at baseline.     GCS: GCS eye subscore is 4. GCS verbal subscore is 5. GCS motor subscore is 6.     Cranial Nerves: Cranial nerves are intact. No cranial nerve deficit, dysarthria or facial asymmetry.     Sensory: Sensation is intact. No sensory deficit.     Motor: Motor function is intact. No weakness, tremor, atrophy, abnormal muscle tone or seizure activity.     Coordination: Coordination is intact. Coordination normal.     Gait: Gait is intact. Gait normal.     Comments: Gait sure and steady in hallway; in/out of chair without difficulty; bilateral hand grasp equal 5/5  Psychiatric:        Attention and Perception: Attention and perception normal.        Mood and Affect: Mood  and affect normal.        Speech: Speech normal.        Behavior: Behavior normal. Behavior is cooperative.        Thought Content: Thought content normal.        Cognition and Memory: Cognition and memory normal.        Judgment: Judgment normal.           Assessment & Plan:  A-pruritis dermatitis, subsequent visit  P-Improving lesions resolving/papules not as large and erythema macular resolved.  Still suspect folliculitis initially then possible MRSA since doxycycline/clobetasol/triamcinolone did not help.  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  Continue soap for sensitive skin without fragrance, stop tgel on scalp.  Avoid hot steamy showers.  May continue calagel 2% topical QID prn itching or hydrocortisone 1% topical BID prn itching wash hands before and after application to affected areas.  Exitcare handout pruritic dermatitis.  Will follow up in 48 hours on Thursday for re-evaluation if worsening prior to dermatology appt Friday 72 hours from now.   Avoid shaving and scratching  until rash healed. May continue zyrtec 10mg  po BID prn itching or zyrtec 10mg  po qam and benadryl 25-50mg  po qhs prn itching.  Discussed use ice instead of itching/scratching also to prevent secondary infections.  Tolerating benadryl/zyrtec without difficulty  Consider apply emollient twice a day e.g. Fragrance free vaseline.  May apply ice/cold compress 5 minutes QID prn itching/swelling.  Shower when he finishes work/prior to bedtime.  Avoid harsh/abrasive soaps use fragrance free/sensitive like dove/cetaphil/neutragena sensitive.    Medication as directed. Call or return to clinic as needed if these symptoms worsen or fail to improve as anticipated. Patient verbalized agreement and understanding of treatment plan and had no further questions at this time  Patient rescheduled fasting labs with RN as was not onsite at work last Friday.

## 2019-08-10 NOTE — Patient Instructions (Signed)
Pruritus Pruritus is an itchy feeling on the skin. One of the most common causes is dry skin, but many different things can cause itching. Most cases of itching do not require medical attention. Sometimes itchy skin can turn into a rash. Follow these instructions at home: Skin care   Apply moisturizing lotion to your skin as needed. Lotion that contains petroleum jelly is best.  Take medicines or apply medicated creams only as told by your health care provider. This may include: ? Corticosteroid cream. ? Anti-itch lotions. ? Oral antihistamines.  Apply a cool, wet cloth (cool compress) to the affected areas.  Take baths with one of the following: ? Epsom salts. You can get these at your local pharmacy or grocery store. Follow the instructions on the packaging. ? Baking soda. Pour a small amount into the bath as told by your health care provider. ? Colloidal oatmeal. You can get this at your local pharmacy or grocery store. Follow the instructions on the packaging.  Apply baking soda paste to your skin. To make the paste, stir water into a small amount of baking soda until it reaches a paste-like consistency.  Do not scratch your skin.  Do not take hot showers or baths, which can make itching worse. A cool shower may help with itching as long as you apply moisturizing lotion after the shower.  Do not use scented soaps, detergents, perfumes, and cosmetic products. Instead, use gentle, unscented versions of these items. General instructions  Avoid wearing tight clothes.  Keep a journal to help find out what is causing your itching. Write down: ? What you eat and drink. ? What cosmetic products you use. ? What soaps or detergents you use. ? What you wear, including jewelry.  Use a humidifier. This keeps the air moist, which helps to prevent dry skin.  Be aware of any changes in your itchiness. Contact a health care provider if:  The itching does not go away after several  days.  You are unusually thirsty or urinating more than normal.  Your skin tingles or feels numb.  Your skin or the white parts of your eyes turn yellow (jaundice).  You feel weak.  You have any of the following: ? Night sweats. ? Tiredness (fatigue). ? Weight loss. ? Abdominal pain. Summary  Pruritus is an itchy feeling on the skin. One of the most common causes is dry skin, but many different conditions and factors can cause itching.  Apply moisturizing lotion to your skin as needed. Lotion that contains petroleum jelly is best.  Take medicines or apply medicated creams only as told by your health care provider.  Do not take hot showers or baths. Do not use scented soaps, detergents, perfumes, or cosmetic products. This information is not intended to replace advice given to you by your health care provider. Make sure you discuss any questions you have with your health care provider. Document Revised: 03/11/2017 Document Reviewed: 03/11/2017 Elsevier Patient Education  2020 Elsevier Inc.  

## 2019-08-12 NOTE — Telephone Encounter (Signed)
Per RN Rolly Salter labs rescheduled to Monday 08/16/2019

## 2019-08-13 ENCOUNTER — Telehealth: Payer: Self-pay | Admitting: *Deleted

## 2019-08-13 MED ORDER — MUPIROCIN 2 % EX OINT: 1.0000 "application " | TOPICAL_OINTMENT | Freq: Two times a day (BID) | CUTANEOUS | 0 refills | Status: DC

## 2019-08-13 NOTE — Telephone Encounter (Signed)
Pt reports that he cancelled dermatology appt as the mupirocin ointment is helping rash. Reports areas of forehead, left side of scalp and chest still have rash present but improving more since 08/10/19 eval by NP. Lesions not resolved yet though so pt asking for refill of mupirocin to continue treatment as the large areas of application, especially on his chest, have used up his supply quickly. Rx refill to pharmacy of choice. Since dermatology appt was cancelled, f/u appt made for NP clinic on 08/17/19 at 0930.

## 2019-08-16 ENCOUNTER — Ambulatory Visit: Payer: Self-pay | Admitting: *Deleted

## 2019-08-16 ENCOUNTER — Other Ambulatory Visit: Payer: Self-pay

## 2019-08-16 DIAGNOSIS — Z0000 Encounter for general adult medical examination without abnormal findings: Secondary | ICD-10-CM

## 2019-08-16 DIAGNOSIS — Z79899 Other long term (current) drug therapy: Secondary | ICD-10-CM

## 2019-08-16 NOTE — Progress Notes (Signed)
Lab draw x1 successful.

## 2019-08-17 ENCOUNTER — Encounter: Payer: Self-pay | Admitting: *Deleted

## 2019-08-17 LAB — CMP12+LP+TP+TSH+6AC+CBC/D/PLT
ALT: 53 IU/L — ABNORMAL HIGH (ref 0–44)
AST: 31 IU/L (ref 0–40)
Albumin/Globulin Ratio: 2.3 — ABNORMAL HIGH (ref 1.2–2.2)
Albumin: 4.6 g/dL (ref 4.0–5.0)
Alkaline Phosphatase: 94 IU/L (ref 48–121)
BUN/Creatinine Ratio: 15 (ref 9–20)
BUN: 16 mg/dL (ref 6–20)
Basophils Absolute: 0 10*3/uL (ref 0.0–0.2)
Basos: 1 %
Bilirubin Total: 0.2 mg/dL (ref 0.0–1.2)
Calcium: 9.4 mg/dL (ref 8.7–10.2)
Chloride: 101 mmol/L (ref 96–106)
Chol/HDL Ratio: 3.7 ratio (ref 0.0–5.0)
Cholesterol, Total: 218 mg/dL — ABNORMAL HIGH (ref 100–199)
Creatinine, Ser: 1.07 mg/dL (ref 0.76–1.27)
EOS (ABSOLUTE): 0.1 10*3/uL (ref 0.0–0.4)
Eos: 2 %
Estimated CHD Risk: 0.6 times avg. (ref 0.0–1.0)
Free Thyroxine Index: 2.4 (ref 1.2–4.9)
GFR calc Af Amer: 103 mL/min/{1.73_m2} (ref >59)
GFR calc non Af Amer: 89 mL/min/{1.73_m2} (ref >59)
GGT: 76 IU/L — ABNORMAL HIGH (ref 0–65)
Globulin, Total: 2 g/dL (ref 1.5–4.5)
Glucose: 106 mg/dL — ABNORMAL HIGH (ref 65–99)
HDL: 59 mg/dL (ref >39)
Hematocrit: 46.6 % (ref 37.5–51.0)
Hemoglobin: 15.8 g/dL (ref 13.0–17.7)
Immature Grans (Abs): 0 10*3/uL (ref 0.0–0.1)
Immature Granulocytes: 0 %
Iron: 82 ug/dL (ref 38–169)
LDH: 188 IU/L (ref 121–224)
LDL Chol Calc (NIH): 149 mg/dL — ABNORMAL HIGH (ref 0–99)
Lymphocytes Absolute: 1.8 10*3/uL (ref 0.7–3.1)
Lymphs: 27 %
MCH: 30 pg (ref 26.6–33.0)
MCHC: 33.9 g/dL (ref 31.5–35.7)
MCV: 89 fL (ref 79–97)
Monocytes Absolute: 0.6 10*3/uL (ref 0.1–0.9)
Monocytes: 10 %
Neutrophils Absolute: 4 10*3/uL (ref 1.4–7.0)
Neutrophils: 60 %
Phosphorus: 3.4 mg/dL (ref 2.8–4.1)
Platelets: 281 10*3/uL (ref 150–450)
Potassium: 4.6 mmol/L (ref 3.5–5.2)
RBC: 5.26 x10E6/uL (ref 4.14–5.80)
RDW: 12.3 % (ref 11.6–15.4)
Sodium: 142 mmol/L (ref 134–144)
T3 Uptake Ratio: 30 % (ref 24–39)
T4, Total: 8.1 ug/dL (ref 4.5–12.0)
TSH: 2.24 u[IU]/mL (ref 0.450–4.500)
Total Protein: 6.6 g/dL (ref 6.0–8.5)
Triglycerides: 60 mg/dL (ref 0–149)
Uric Acid: 4.9 mg/dL (ref 3.8–8.4)
VLDL Cholesterol Cal: 10 mg/dL (ref 5–40)
WBC: 6.5 10*3/uL (ref 3.4–10.8)

## 2019-08-17 LAB — MAGNESIUM: Magnesium: 2.2 mg/dL (ref 1.6–2.3)

## 2019-08-17 LAB — HGB A1C W/O EAG: Hgb A1c MFr Bld: 5.4 % (ref 4.8–5.6)

## 2019-08-17 NOTE — Progress Notes (Signed)
Reviewed results with pt over phone. Creatinine has returned to [facility] after 3+ years mildly elevated. HFP stable from one year ago. Lipids improving from one year ago. Pt reports still being on keto diet. Blood counts also returned to [facility]. Magnesium and A1c both normal.  Diet and exercise recommendations discussed re: LDL, ALT/GGT, health maintenance. Routine f/u with pcp. No hard copy of labs offered since available through MyChart. No further questions/concerns.

## 2019-08-17 NOTE — Progress Notes (Signed)
noted 

## 2019-08-17 NOTE — Telephone Encounter (Signed)
Labs completed and patient notified of results by RN Rolly Salter via telephone see results note.

## 2019-08-17 NOTE — Telephone Encounter (Signed)
Electronic Rx signed and RN Rolly Salter note reviewed.  Patient seen in clinic today for re-evaluation sparse papules nonerythematous very few with scabs noted to tips now remaining on neck and chest 99% cleared.  Patient did not want to remove hat in clinic due to other patients present in waiting room.  Continue mupirocin 2% BID until all lesions healed.  May use trimmers but do not use razors on scalp/neck/chest until all lesions healed and recommend he sanitize razor/trimmers between uses.  Follow up prn especially if worsening (spreading/increase number of papules or signs of infection/purulent drainage/red/hot to touch/tender.)  Discussed with patient probable MRSA based on symptoms and response to mupirocin and not shaving but minimal response antihistamines and topical steroids.  Patient verbalized understanding information/instructions, agreed with plan of care and had no further questions at this time.

## 2019-09-21 ENCOUNTER — Encounter: Payer: Self-pay | Admitting: Registered Nurse

## 2019-09-21 ENCOUNTER — Telehealth: Payer: Self-pay | Admitting: Registered Nurse

## 2019-09-21 MED ORDER — OMEPRAZOLE 40 MG PO CPDR: 40.0000 mg | DELAYED_RELEASE_CAPSULE | Freq: Every day | ORAL | 0 refills | Status: DC

## 2019-09-21 NOTE — Telephone Encounter (Signed)
Patient requested refill omeprazole 40mg  po daily #90 RF0.  Notified patient will need new rx from [facility] when he has next visit.  Has been working well for patient chronic heartburn controlled as long as he takes omeprazole.  Last PDRx fill 06/29/2019

## 2019-10-18 IMAGING — CT CT ANGIOGRAPHY CHEST
1 of 6 series · 3 of 16 positions shown · IV contrast (omnipaque)
Comparison: Same day chest radiograph

CLINICAL DATA: Shortness of breath, chest tightness

EXAM:
CT ANGIOGRAPHY CHEST WITH CONTRAST
TECHNIQUE: Multidetector CT imaging of the chest was performed using the
standard protocol during bolus administration of intravenous
contrast. Multiplanar CT image reconstructions and MIPs were
obtained to evaluate the vascular anatomy.
CONTRAST:  100mL OMNIPAQUE IOHEXOL 350 MG/ML SOLN

[Series 7: pe thins · axial · 0.73mm/px · z∈[-207,-68]mm · 3 of 398 slices shown]
[im 100/398  lung]
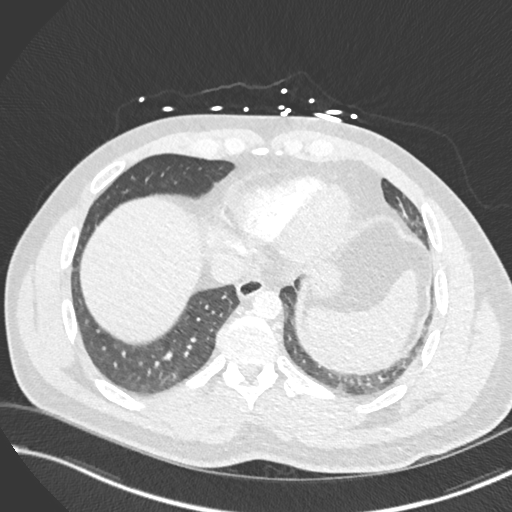
[im 199/398  soft-tissue]
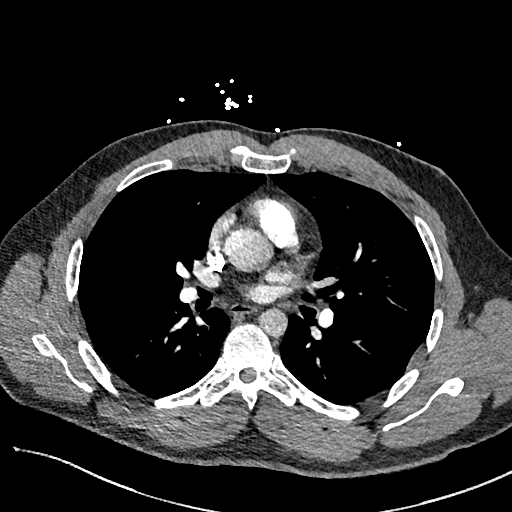
[im 298/398  lung]
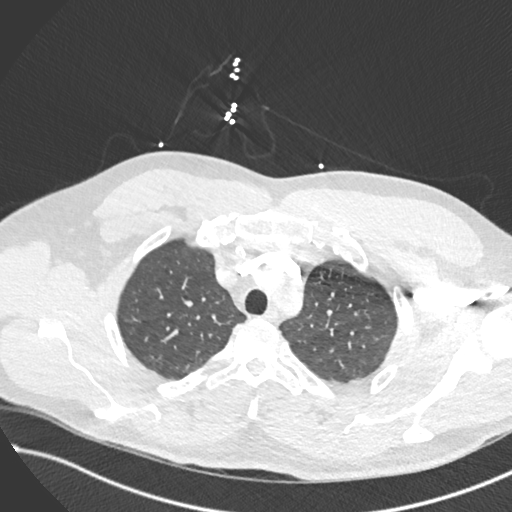

[3 of 16 positions shown; findings below may reference images not displayed]

FINDINGS: Cardiovascular: Satisfactory opacification of the pulmonary arteries
to the segmental level. No evidence of pulmonary embolism. Normal
heart size. No pericardial effusion.

Mediastinum/Nodes: No enlarged mediastinal, hilar, or axillary lymph
nodes. Thyroid gland, trachea, and esophagus demonstrate no
significant findings.

Lungs/Pleura: Lungs are clear. No pleural effusion or pneumothorax.

Upper Abdomen: No acute abnormality.

Musculoskeletal: No chest wall abnormality. No acute or significant
osseous findings.

Review of the MIP images confirms the above findings.
IMPRESSION: Negative examination for pulmonary embolism.

## 2019-10-18 IMAGING — DX PORTABLE CHEST - 1 VIEW
1 series · 1 of 1 positions shown · non-contrast
Comparison: 04/04/2015

CLINICAL DATA: Dyspnea, chest tightness, hand numbness x2 weeks. Hx
of hypertension.

EXAM:
PORTABLE CHEST 1 VIEW

[chest ap]
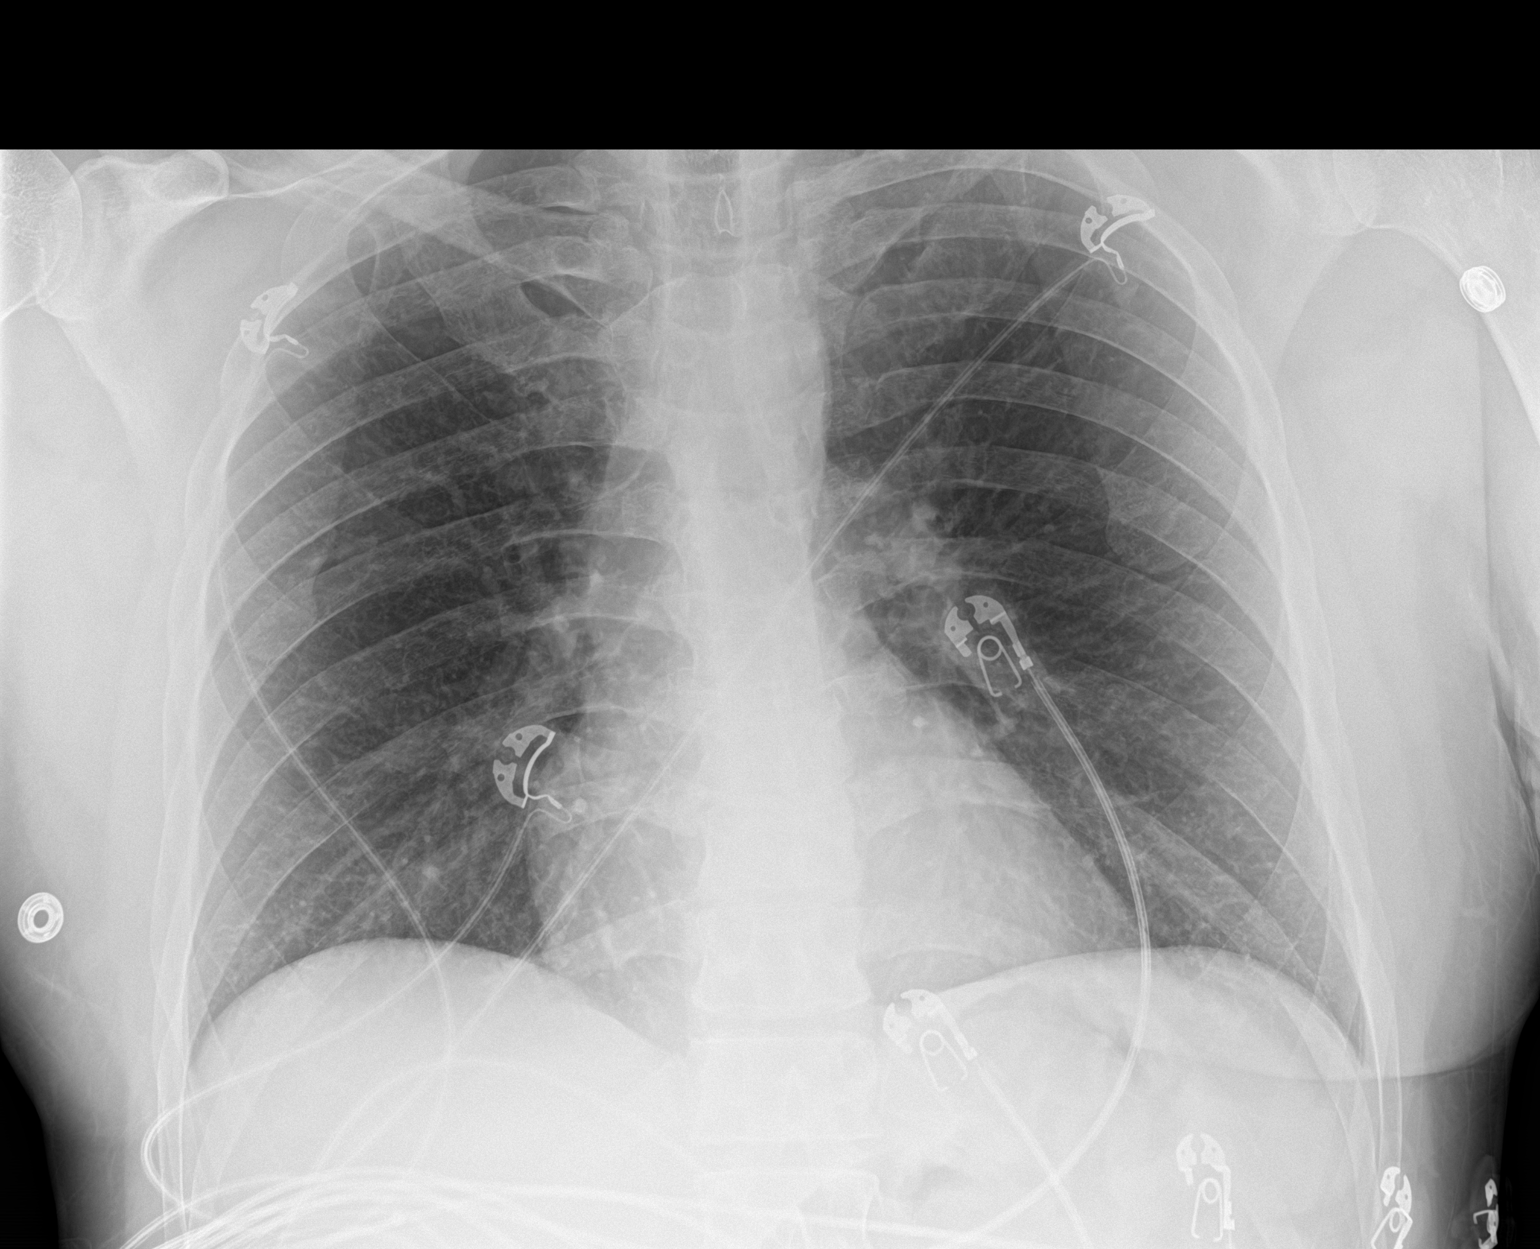

[1 of 1 positions shown; findings below may reference images not displayed]

FINDINGS: The heart size and mediastinal contours are within normal limits.
Both lungs are clear. The visualized skeletal structures are
unremarkable.
IMPRESSION: No active disease.

## 2019-12-30 ENCOUNTER — Telehealth: Payer: Self-pay | Admitting: Registered Nurse

## 2019-12-30 ENCOUNTER — Encounter: Payer: Self-pay | Admitting: Registered Nurse

## 2019-12-30 DIAGNOSIS — R12 Heartburn: Secondary | ICD-10-CM

## 2019-12-30 MED ORDER — OMEPRAZOLE 40 MG PO CPDR: 40.0000 mg | DELAYED_RELEASE_CAPSULE | Freq: Every day | ORAL | 0 refills | Status: DC

## 2019-12-30 NOTE — Telephone Encounter (Signed)
Patient requested refill omeprazole 40mg  po daily #90 RF0.  Notified patient will need new rx from [facility] when he has next visit.  Has been working well for patient chronic heartburn controlled as long as he takes omeprazole.  Last PDRx fill 09/21/2019  Consider magnesium level with next blood draw.  Last labs 08/16/2019.  Patient verbalized understanding information/instructions, agreed with plan of care and had no further questions at this time.

## 2020-01-12 ENCOUNTER — Telehealth: Payer: Self-pay | Admitting: Registered Nurse

## 2020-01-12 ENCOUNTER — Encounter: Payer: Self-pay | Admitting: Registered Nurse

## 2020-01-12 ENCOUNTER — Other Ambulatory Visit: Payer: Self-pay | Admitting: Registered Nurse

## 2020-01-12 DIAGNOSIS — R0600 Dyspnea, unspecified: Secondary | ICD-10-CM

## 2020-01-12 DIAGNOSIS — R0609 Other forms of dyspnea: Secondary | ICD-10-CM

## 2020-01-12 NOTE — Telephone Encounter (Signed)
Notified by HR patient and another employee both had breathing problems today at work and left.  Coworker seen at urgent care. Patient contacted via telephone and reported that he and this coworker had 1 hour training together on Monday 1 Nov and 30 minutes on 2 Nov close contact wearing masks.  A third employee had asthma attack in same workcenter today and went home.  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  Year round allergies for patient.  Anxiety medication changes 12/07/2019 x 2 due follow up with PCM.  Patient reported the first couple days of taking venlafaxine headaches, stomach hurt, not feeling good but resolved until symptoms today.  Per up to date dizzyness/lightheadedness occurs in 15% patients and less than 1% cardiac symptoms.  Patient reported walking in from car today short of breath and then started to get light headed.  Supervisor sent patient home today due to covid pandemic.  Pulse 100 currently.  Respirations even and unlabored not feeling short of breath.  He was a little winded earlier today at home taking trash cans to curb.  Notices whenever he exerts himself short of breath recently.  spoke full sentences without difficulty, no cough/throat clearing/nasal congestion/clearing during telephone conversation.  Duration of call 22 minutes.  Second Pfizer covid vaccination May 2021.  Denied self or others sick at home recently.  Has not gotten flu vaccine for this season yet.  Since patient reported some fatigue night prior on 01/10/2020 will send for PCR COVID TESTING day 3 [facility]13[facility]2020 [facility] [address] [place].  Patient to quarantine at home.  Discussed if worsening breathing/shortness of breath, blue lips/face, chest pain or syncope to see provider for evaluation.  Patient has had heat on in house since last weekend and used heater in car starting last week  so those were not new for him.  Denied others sick at home or sick contacts.  PMHx covid infection 04/05/2019 positive test.  Has never had sp02 monitor at home to monitor oxygen levels per patient or required inhaler.  Discussed with patient I have not seen reinfection after vaccination and covid infection in healthy patient yet but I have seen in immunocompromised patients.  Patient eligible for covid booster later this month 01/23/2020 or later. Covid sequelae have occurred after infection/vaccine reported in literature.  Consider appt with PCM/chest xray/EKG. Patient reported he can not work remote from home.  Discussed with patient this could be medication side effect, post covid sequelae, new URI, stress/anxiety.  Will see if any sp02 monitors available to send to his house for use to loan from clinic.  Keep follow up as scheduled with PCM for anxiety medication follow up.  Patient asked if rapid test can be done.  Explained that with symptoms best to have PCR as accuracy rapid tests for positive symptoms 80-85% versus asymptomatic negative screening 99% versus PCR.  PCR recommended at this time.  Continue his allergy medications and notify clinic if any new or worsening symptoms also.  Screen shot of testing confirmation will be emailed to his gmail account at termination of call.  [facility]  HR team notified patient started on quarantine today day.  Day 2 of symptoms.  Patient did not develop sx of cough, chest pain, nausea, vomiting, diarrhea, sore throat, HA, body aches, fever or chills.   [place] [facility] closest testing site for  pt. Reviewed drive up testing instructions and directions with pt, ie attend appt time, remain in vehicle, wear mask, results back in 1-2 days via text link only valid for 5 days per [facility] website.  Plan to follow up with patient tomorrow symptom update and RN Rolly Salter Friday for anticipated results. If sx improving and no fever/vomiting/diarrhea or new  symptoms in previous >24hrs without antipyretics, pt to complete quarantine after negative test results.     Reviewed possible Covid sx including cough, shortness of breath with exertion or at rest, runny nose, congestion, sinus pain/pressure, sore throat, fever/chills, body aches, fatigue, loss of taste/smell, GI symptoms of nausea/vomiting/diarrhea. Also reviewed same day/emergent eval/ER precautions of dizziness/syncope, confusion, blue tint to lips/face, severe shortness of breath/difficulty breathing/wheezing.   Patient to isolate in own room and if possible use only one bathroom if living with others in home.  Wear mask when out of room to help prevent spread to others in household.   Pt verbalized understanding and agreement with plan of care. No further questions/concerns at this time. Pt reminded to contact clinic with any changes in sx or questions/concerns.

## 2020-01-14 NOTE — Telephone Encounter (Signed)
Patient notified me covid test negative and symptoms have resolved.  Feeling well denied shortness of breath with exertion/dyspnea/fever/chills/n/v/d.  Patient works M-F cleared to RTW Monday 8 Nov.  HR Team notified.  Patient verbalized understanding information/instructions, agreed with plan of care and had no further questions at this time.

## 2020-01-18 NOTE — Telephone Encounter (Signed)
Pt RTW Monday 11/8 as expected. Stopped by RN office. Reported feeling well. Had no complaints.

## 2020-01-18 NOTE — Telephone Encounter (Signed)
Noted patient seen in workcenter today feeling well, no further questions, reviewed RN Rolly Salter note.

## 2020-02-20 ENCOUNTER — Telehealth: Payer: Self-pay | Admitting: Registered Nurse

## 2020-02-20 ENCOUNTER — Encounter: Payer: Self-pay | Admitting: Registered Nurse

## 2020-02-20 DIAGNOSIS — J3489 Other specified disorders of nose and nasal sinuses: Secondary | ICD-10-CM

## 2020-02-20 MED ORDER — FLUTICASONE PROPIONATE 50 MCG/ACT NA SUSP: 1.0000 | Freq: Two times a day (BID) | NASAL | 0 refills | Status: AC

## 2020-02-20 MED ORDER — SALINE SPRAY 0.65 % NA SOLN: 2.0000 | NASAL | 0 refills | Status: AC

## 2020-02-20 NOTE — Telephone Encounter (Signed)
HR manager Salley Slaughter notified NP that pt reported migraine 02/18/2020 and left work early Friday.  Supervisor reported patient with nasal symptoms today to HR and HR requested clinic follow up with patient for covid triage.  Patient did not develop sx of cough, trouble breathing, chest pain, nausea, vomiting, diarrhea, sore throat, fatigue, body aches, fever or chills.   Day 1 of quarantine 02/20/2020.  Testing day 3 if no improvement with restarting flonase/saline along with claritin.  Currently 48 hour allergy meds quarantine. Patient only works M-F.     Reviewed possible Covid sx including cough, shortness of breath with exertion or at rest, runny nose, congestion, sinus pain/pressure, sore throat, fever/chills, body aches, fatigue, loss of taste/smell, GI symptoms of nausea/vomiting/diarrhea. Discussed there is noncovid/nonflu virus URI also circulating in community and known that others in his work area have been diagnosed with this in previous 2 weeks.  But covid circulation in community also on upswing.  Patient to isolate in own room and if possible use only one bathroom if living with others in home.  Wear mask when out of room to help prevent spread to others in household.   Patient contacted via telephone has head congestion due to weather changes that also triggered migraine this weekend.  Taking his claritin every day not using flonase/nasal saline.  Discussed with patient 48 hour quarantine using allergy medicine and if not improving then covid testing indicated.  Reviewed epic patient typically not seen in clinic for allergy flare at this time. He will notify his supervisor and I will notify HR.  RN Rolly Salter to follow up with patient tomorrow afternoon to see if any improvement with flonase/saline.  Patient congested voice on phone today no cough, spoke full sentences without difficulty A&Ox3 respirations even and unlabored.   Patient verbalized understanding information/instructions, agreed with  plan of care and had no further questions at this time.

## 2020-02-21 NOTE — Telephone Encounter (Signed)
Spoke with pt over phone. He reported that all sx really started on 12/10. Sts he always has some nasal congestion and drainage but woke up Friday with it worse than his normal. As he was at work, they worsened and migraine developed. He did go home early that day. Started allergy meds but sx progressed and he knew if they did he would have to have testing performed. So he went ahead and had PCR Covid test done this morning. Reports sx are same as when NP spoke to him yesterday. No worse, just persistent.   Last day on-site: 02/18/20 Day 1 of Sx: 02/18/20 Day 1 of quarantine: 02/18/20 Testing between sx days 3-5 Test already done 12/13 AM Day 7 quarantine: 02/24/20 RTW date: 02/25/20 if test negative and sx improving.

## 2020-02-21 NOTE — Telephone Encounter (Signed)
Noted and case discussed with RN Rolly Salter via telephone agree with plan of care and pending test results.

## 2020-02-23 NOTE — Telephone Encounter (Signed)
Reviewed epic negative covid test care [facility].  Patient contacted via telephone reported headache resolved along with congestion improving but throat still sore today.  He knows he was around sick coworker who test negative for covid/flu also I think I got whatever she had.  He feels able and ready to return to work denied fever/chills/vomiting/diarrhea or worsening symptoms.  Patient notified another weather front headed from [place] today cold/171mph winds to prepare for weather change possible next couple days regarding his migraine prophylaxis.  Discussed flu circulation increasing in community and covid still high along with this other viral URI.  Discussed omnicron variant in [place] now also and still unknown how this is going to affect vaccinated/post covid delta patients.  Patient to email me or call clinic if new or worsening symptoms and stay home if fever/chills/vomiting/diarrhea within 24 hours of RTW date.  Patient mildly congested during call nasal.  No cough  A&Ox3 respirations even and unlabored spoke full sentences without difficulty.  Patient verbalized understanding information/instructions, agreed with plan of care and had no further questions at this time.

## 2020-02-24 NOTE — Telephone Encounter (Signed)
Voicemail left for patient checking in to make sure symptoms still improving/no new symptoms or vomiting/diarrhea/fever/chills today as scheduled to return to work tomorrow.  Patient can email [email or web address].  RN Rolly Salter unexpectedly out of clinic today and unsure if she will be back in office tomorrow due to family emergency.

## 2020-02-25 NOTE — Telephone Encounter (Signed)
Spoke with pt by phone. He is at work and reports sx are still present but improving. Feels well. Denies further needs. Closing encounter.

## 2020-02-26 NOTE — Telephone Encounter (Signed)
Noted RTW and feels well reviewed RN Rolly Salter note

## 2020-02-29 ENCOUNTER — Ambulatory Visit: Payer: Self-pay | Admitting: Registered Nurse

## 2020-02-29 ENCOUNTER — Encounter: Payer: Self-pay | Admitting: Registered Nurse

## 2020-02-29 ENCOUNTER — Other Ambulatory Visit: Payer: Self-pay

## 2020-02-29 VITALS — HR 108 | Temp 98.1°F | Resp 18

## 2020-02-29 DIAGNOSIS — J209 Acute bronchitis, unspecified: Secondary | ICD-10-CM

## 2020-02-29 DIAGNOSIS — J309 Allergic rhinitis, unspecified: Secondary | ICD-10-CM | POA: Insufficient documentation

## 2020-02-29 DIAGNOSIS — F419 Anxiety disorder, unspecified: Secondary | ICD-10-CM | POA: Insufficient documentation

## 2020-02-29 DIAGNOSIS — J019 Acute sinusitis, unspecified: Secondary | ICD-10-CM

## 2020-02-29 MED ORDER — PREDNISONE 10 MG PO TABS: 20.0000 mg | ORAL_TABLET | Freq: Every day | ORAL | Status: AC

## 2020-02-29 NOTE — Patient Instructions (Signed)
Take dayquil/nyquil per manufacturer instructions Continue cough drops and honey 1 tablespoon every 4 hours as needed for cough Follow up if bad taste or smell or brown cloudy mucous with or without fever  How to Perform a Sinus Rinse A sinus rinse is a home treatment that is used to rinse your sinuses with a sterile mixture of salt and water (saline solution). Sinuses are air-filled spaces in your skull behind the bones of your face and forehead that open into your nasal cavity. A sinus rinse can help to clear mucus, dirt, dust, or pollen from your nasal cavity. You may do a sinus rinse when you have a cold, a virus, nasal allergy symptoms, a sinus infection, or stuffiness in your nose or sinuses. Talk with your health care provider about whether a sinus rinse might help you. What are the risks? A sinus rinse is generally safe and effective. However, there are a few risks, which include:  A burning sensation in your sinuses. This may happen if you do not make the saline solution as directed. Be sure to follow all directions when making the saline solution.  Nasal irritation.  Infection from contaminated water. This is rare, but possible. Do not do a sinus rinse if you have had ear or nasal surgery, ear infection, or blocked ears. Supplies needed:  Saline solution or powder.  Distilled or sterile water may be needed to mix with saline powder. ? You may use boiled and cooled tap water. Boil tap water for 5 minutes; cool until it is lukewarm. Use within 24 hours. ? Do not use regular tap water to mix with the saline solution.  Neti pot or nasal rinse bottle. These supplies release the saline solution into your nose and through your sinuses. Neti pots and nasal rinse bottles can be purchased at [employer], a health food store, or online. How to perform a sinus rinse  1. Wash your hands with soap and water. 2. Wash your device according to the directions that came with the product  and then dry it. 3. Use the solution that comes with your product or one that is sold separately in stores. Follow the mixing directions on the package if you need to mix with sterile or distilled water. 4. Fill the device with the amount of saline solution noted in the device instructions. 5. Stand over a sink and tilt your head sideways over the sink. 6. Place the spout of the device in your upper nostril (the one closer to the ceiling). 7. Gently pour or squeeze the saline solution into your nasal cavity. The liquid should drain out from the lower nostril if you are not too congested. 8. While rinsing, breathe through your open mouth. 9. Gently blow your nose to clear any mucus and rinse solution. Blowing too hard may cause ear pain. 10. Repeat in your other nostril. 11. Clean and rinse your device with clean water and then air-dry it. Talk with your health care provider or pharmacist if you have questions about how to do a sinus rinse. Summary  A sinus rinse is a home treatment that is used to rinse your sinuses with a sterile mixture of salt and water (saline solution).  A sinus rinse is generally safe and effective. Follow all instructions carefully.  Before doing a sinus rinse, talk with your health care provider about whether it would be helpful for you. This information is not intended to replace advice given to you by your health care provider.  Make sure you discuss any questions you have with your health care provider. Document Revised: 12/23/2016 Document Reviewed: 12/23/2016 Elsevier Patient Education  2020 [employer] Sinusitis, Adult Sinusitis is inflammation of your sinuses. Sinuses are hollow spaces in the bones around your face. Your sinuses are located:  Around your eyes.  In the middle of your forehead.  Behind your nose.  In your cheekbones. Mucus normally drains out of your sinuses. When your nasal tissues become inflamed or swollen, mucus can become trapped or  blocked. This allows bacteria, viruses, and fungi to grow, which leads to infection. Most infections of the sinuses are caused by a virus. Sinusitis can develop quickly. It can last for up to 4 weeks (acute) or for more than 12 weeks (chronic). Sinusitis often develops after a cold. What are the causes? This condition is caused by anything that creates swelling in the sinuses or stops mucus from draining. This includes:  Allergies.  Asthma.  Infection from bacteria or viruses.  Deformities or blockages in your nose or sinuses.  Abnormal growths in the nose (nasal polyps).  Pollutants, such as chemicals or irritants in the air.  Infection from fungi (rare). What increases the risk? You are more likely to develop this condition if you:  Have a weak body defense system (immune system).  Do a lot of swimming or diving.  Overuse nasal sprays.  Smoke. What are the signs or symptoms? The main symptoms of this condition are pain and a feeling of pressure around the affected sinuses. Other symptoms include:  Stuffy nose or congestion.  Thick drainage from your nose.  Swelling and warmth over the affected sinuses.  Headache.  Upper toothache.  A cough that may get worse at night.  Extra mucus that collects in the throat or the back of the nose (postnasal drip).  Decreased sense of smell and taste.  Fatigue.  A fever.  Sore throat.  Bad breath. How is this diagnosed? This condition is diagnosed based on:  Your symptoms.  Your medical history.  A physical exam.  Tests to find out if your condition is acute or chronic. This may include: ? Checking your nose for nasal polyps. ? Viewing your sinuses using a device that has a light (endoscope). ? Testing for allergies or bacteria. ? Imaging tests, such as an MRI or CT scan. In rare cases, a bone biopsy may be done to rule out more serious types of fungal sinus disease. How is this treated? Treatment for sinusitis  depends on the cause and whether your condition is chronic or acute.  If caused by a virus, your symptoms should go away on their own within 10 days. You may be given medicines to relieve symptoms. They include: ? Medicines that shrink swollen nasal passages (topical intranasal decongestants). ? Medicines that treat allergies (antihistamines). ? A spray that eases inflammation of the nostrils (topical intranasal corticosteroids). ? Rinses that help get rid of thick mucus in your nose (nasal saline washes).  If caused by bacteria, your health care provider may recommend waiting to see if your symptoms improve. Most bacterial infections will get better without antibiotic medicine. You may be given antibiotics if you have: ? A severe infection. ? A weak immune system.  If caused by narrow nasal passages or nasal polyps, you may need to have surgery. Follow these instructions at home: Medicines  Take, use, or apply over-the-counter and prescription medicines only as told by your health care provider. These may include nasal sprays.  If you were prescribed an antibiotic medicine, take it as told by your health care provider. Do not stop taking the antibiotic even if you start to feel better. Hydrate and humidify   Drink enough fluid to keep your urine pale yellow. Staying hydrated will help to thin your mucus.  Use a cool mist humidifier to keep the humidity level in your home above 50%.  Inhale steam for 10-15 minutes, 3-4 times a day, or as told by your health care provider. You can do this in the bathroom while a hot shower is running.  Limit your exposure to cool or dry air. Rest  Rest as much as possible.  Sleep with your head raised (elevated).  Make sure you get enough sleep each night. General instructions   Apply a warm, moist washcloth to your face 3-4 times a day or as told by your health care provider. This will help with discomfort.  Wash your hands often with soap  and water to reduce your exposure to germs. If soap and water are not available, use hand sanitizer.  Do not smoke. Avoid being around people who are smoking (secondhand smoke).  Keep all follow-up visits as told by your health care provider. This is important. Contact a health care provider if:  You have a fever.  Your symptoms get worse.  Your symptoms do not improve within 10 days. Get help right away if:  You have a severe headache.  You have persistent vomiting.  You have severe pain or swelling around your face or eyes.  You have vision problems.  You develop confusion.  Your neck is stiff.  You have trouble breathing. Summary  Sinusitis is soreness and inflammation of your sinuses. Sinuses are hollow spaces in the bones around your face.  This condition is caused by nasal tissues that become inflamed or swollen. The swelling traps or blocks the flow of mucus. This allows bacteria, viruses, and fungi to grow, which leads to infection.  If you were prescribed an antibiotic medicine, take it as told by your health care provider. Do not stop taking the antibiotic even if you start to feel better.  Keep all follow-up visits as told by your health care provider. This is important. This information is not intended to replace advice given to you by your health care provider. Make sure you discuss any questions you have with your health care provider. Document Revised: 07/28/2017 Document Reviewed: 07/28/2017 Elsevier Patient Education  2020 [employer] Cough, Adult Coughing is a reflex that clears your throat and your airways (respiratory system). Coughing helps to heal and protect your lungs. It is normal to cough occasionally, but a cough that happens with other symptoms or lasts a long time may be a sign of a condition that needs treatment. An acute cough may only last 2-3 weeks, while a chronic cough may last 8 or more weeks. Coughing is commonly caused by:  Infection  of the respiratory systemby viruses or bacteria.  Breathing in substances that irritate your lungs.  Allergies.  Asthma.  Mucus that runs down the back of your throat (postnasal drip).  Smoking.  Acid backing up from the stomach into the esophagus (gastroesophageal reflux).  Certain medicines.  Chronic lung problems.  Other medical conditions such as heart failure or a blood clot in the lung (pulmonary embolism). Follow these instructions at home: Medicines  Take over-the-counter and prescription medicines only as told by your health care provider.  Talk with your health care provider  before you take a cough suppressant medicine. Lifestyle   Avoid cigarette smoke. Do not use any products that contain nicotine or tobacco, such as cigarettes, e-cigarettes, and chewing tobacco. If you need help quitting, ask your health care provider.  Drink enough fluid to keep your urine pale yellow.  Avoid caffeine.  Do not drink alcohol if your health care provider tells you not to drink. General instructions   Pay close attention to changes in your cough. Tell your health care provider about them.  Always cover your mouth when you cough.  Avoid things that make you cough, such as perfume, candles, cleaning products, or campfire or tobacco smoke.  If the air is dry, use a cool mist vaporizer or humidifier in your bedroom or your home to help loosen secretions.  If your cough is worse at night, try to sleep in a semi-upright position.  Rest as needed.  Keep all follow-up visits as told by your health care provider. This is important. Contact a health care provider if you:  Have new symptoms.  Cough up pus.  Have a cough that does not get better after 2-3 weeks or gets worse.  Cannot control your cough with cough suppressant medicines and you are losing sleep.  Have pain that gets worse or pain that is not helped with medicine.  Have a fever.  Have unexplained weight  loss.  Have night sweats. Get help right away if:  You cough up blood.  You have difficulty breathing.  Your heartbeat is very fast. These symptoms may represent a serious problem that is an emergency. Do not wait to see if the symptoms will go away. Get medical help right away. Call your local emergency services (911 in the [place] Do not drive yourself to the hospital. Summary  Coughing is a reflex that clears your throat and your airways. It is normal to cough occasionally, but a cough that happens with other symptoms or lasts a long time may be a sign of a condition that needs treatment.  Take over-the-counter and prescription medicines only as told by your health care provider.  Always cover your mouth when you cough.  Contact a health care provider if you have new symptoms or a cough that does not get better after 2-3 weeks or gets worse. This information is not intended to replace advice given to you by your health care provider. Make sure you discuss any questions you have with your health care provider. Document Revised: 03/16/2018 Document Reviewed: 03/16/2018 Elsevier Patient Education  2020 [employer] Acute Bronchitis, Adult  Acute bronchitis is sudden or acute swelling of the air tubes (bronchi) in the lungs. Acute bronchitis causes these tubes to fill with mucus, which can make it hard to breathe. It can also cause coughing or wheezing. In adults, acute bronchitis usually goes away within 2 weeks. A cough caused by bronchitis may last up to 3 weeks. Smoking, allergies, and asthma can make the condition worse. What are the causes? This condition can be caused by germs and by substances that irritate the lungs, including:  Cold and flu viruses. The most common cause of this condition is the virus that causes the common cold.  Bacteria.  Substances that irritate the lungs, including: ? Smoke from cigarettes and other forms of tobacco. ? Dust and pollen. ? Fumes from  chemical products, gases, or burned fuel. ? Other materials that pollute indoor or outdoor air.  Close contact with someone who has acute bronchitis. What increases  the risk? The following factors may make you more likely to develop this condition:  A weak body's defense system, also called the immune system.  A condition that affects your lungs and breathing, such as asthma. What are the signs or symptoms? Common symptoms of this condition include:  Lung and breathing problems, such as: ? Coughing. This may bring up clear, yellow, or green mucus from your lungs (sputum). ? Wheezing. ? Having too much mucus in your lungs (chest congestion). ? Having shortness of breath.  A fever.  Chills.  Aches and pains, including: ? Tightness in your chest and other body aches. ? A sore throat. How is this diagnosed? This condition is usually diagnosed based on:  Your symptoms and medical history.  A physical exam. You may also have other tests, including tests to rule out other conditions, such pneumonia. These tests include:  A test of lung function.  Test of a mucus sample to look for the presence of bacteria.  Tests to check the oxygen level in your blood.  Blood tests.  Chest X-ray. How is this treated? Most cases of acute bronchitis clear up over time without treatment. Your health care provider may recommend:  Drinking more fluids. This can thin your mucus, which may improve your breathing.  Taking a medicine for a fever or cough.  Using a device that gets medicine into your lungs (inhaler) to help improve breathing and control coughing.  Using a vaporizer or a humidifier. These are machines that add water to the air to help you breathe better. Follow these instructions at home: Activity  Get plenty of rest.  Return to your normal activities as told by your health care provider. Ask your health care provider what activities are safe for you. Lifestyle  Drink  enough fluid to keep your urine pale yellow.  Do not drink alcohol.  Do not use any products that contain nicotine or tobacco, such as cigarettes, e-cigarettes, and chewing tobacco. If you need help quitting, ask your health care provider. Be aware that: ? Your bronchitis will get worse if you smoke or breathe in other people's smoke (secondhand smoke). ? Your lungs will heal faster if you quit smoking. General instructions   Take over-the-counter and prescription medicines only as told by your health care provider.  Use an inhaler, vaporizer, or humidifier as told by your health care provider.  If you have a sore throat, gargle with a salt-water mixture 3-4 times a day or as needed. To make a salt-water mixture, completely dissolve -1 tsp (3-6 g) of salt in 1 cup (237 mL) of warm water.  Keep all follow-up visits as told by your health care provider. This is important. How is this prevented? To lower your risk of getting this condition again:  Wash your hands often with soap and water. If soap and water are not available, use hand sanitizer.  Avoid contact with people who have cold symptoms.  Try not to touch your mouth, nose, or eyes with your hands.  Avoid places where there are fumes from chemicals. Breathing these fumes will make your condition worse.  Get the flu shot every year. Contact a health care provider if:  Your symptoms do not improve after 2 weeks of treatment.  You vomit more than once or twice.  You have symptoms of dehydration such as: ? Dark urine. ? Dry skin or eyes. ? Increased thirst. ? Headaches. ? Confusion. ? Muscle cramps. Get help right away if you:  Cough up blood.  Feel pain in your chest.  Have severe shortness of breath.  Faint or keep feeling like you are going to faint.  Have a severe headache.  Have fever or chills that get worse. These symptoms may represent a serious problem that is an emergency. Do not wait to see if the  symptoms will go away. Get medical help right away. Call your local emergency services (911 in the [place] Do not drive yourself to the hospital. Summary  Acute bronchitis is sudden (acute) inflammation of the air tubes (bronchi) between the windpipe and the lungs. In adults, acute bronchitis usually goes away within 2 weeks, although coughing may last 3 weeks or longer  Take over-the-counter and prescription medicines only as told by your health care provider.  Drink enough fluid to keep your urine pale yellow.  Contact a health care provider if your symptoms do not improve after 2 weeks of treatment.  Get help right away if you cough up blood, faint, or have chest pain or shortness of breath. This information is not intended to replace advice given to you by your health care provider. Make sure you discuss any questions you have with your health care provider. Document Revised: 11/09/2018 Document Reviewed: 09/18/2018 [employer] Patient Education  2020 [employer]

## 2020-02-29 NOTE — Progress Notes (Signed)
Subjective:    Patient ID: Lucas Barrett, male    DOB: [birth year], 34 y.o.   MRN: [id number]  34y/o caucasian male established married patient was notified by HR getting complaints patient coughing in [facility]center and to follow up with patient due to covid pandemic.  Patient reported cough worsening again still having yellow mucous denied bad taste in mouth or cloudy sputum.  Denied bad smell, fever, chills.  Tried dayquil and then switched to mucinex and menthol cough lozenges because that combination seemed to work better.  Throat a little sore, gets tickle in back of throat then cough starts again after cough drop wears off. Intermittent headache.  Cold front moved into area again today high 40/rainy.  Patient previously on quarantine and had covid testing at beginning of this illness course.  Omicron circulation in [place] high now per news reports today.  Patient is vaccinated and has history of seasonal allergies along with GERD.  Not currently taking antihistamine.  Is doing nose sprays.  Spouse now has cough but he was sick first.     Review of Systems  Constitutional: Positive for fatigue. Negative for activity change, appetite change, chills, diaphoresis and fever.  HENT: Positive for postnasal drip, rhinorrhea, sinus pressure and sore throat. Negative for ear discharge, ear pain, facial swelling, hearing loss, mouth sores, nosebleeds, sinus pain, sneezing, tinnitus, trouble swallowing and voice change.   Eyes: Negative for photophobia and visual disturbance.  Respiratory: Positive for cough. Negative for choking, chest tightness, shortness of breath, wheezing and stridor.   Cardiovascular: Negative for chest pain and palpitations.  Gastrointestinal: Negative for diarrhea, nausea and vomiting.  Endocrine: Negative for cold intolerance and heat intolerance.  Genitourinary: Negative for difficulty urinating.  Musculoskeletal: Negative for arthralgias, back pain, gait problem, joint swelling,  myalgias, neck pain and neck stiffness.  Skin: Negative for rash.  Allergic/Immunologic: Positive for environmental allergies. Negative for food allergies.  Neurological: Positive for headaches. Negative for dizziness, tremors, seizures, syncope, facial asymmetry, speech difficulty, weakness, light-headedness and numbness.  Hematological: Negative for adenopathy. Does not bruise/bleed easily.  Psychiatric/Behavioral: Negative for agitation, confusion and sleep disturbance.       Objective:   Physical Exam Vitals and nursing note reviewed.  Constitutional:      General: He is awake and active. He is not in acute distress.Vital signs are normal.     Appearance: Normal appearance. He is well-developed, well-groomed, normal weight and well-nourished. He is ill-appearing. He is not toxic-appearing, sickly-appearing or diaphoretic.  HENT:     Head: Normocephalic and atraumatic.     Jaw: There is normal jaw occlusion. No trismus.     Salivary Glands: Right salivary gland is not diffusely enlarged or tender. Left salivary gland is not diffusely enlarged or tender.     Right Ear: Hearing, ear canal and external ear normal. A middle ear effusion is present. There is no impacted cerumen.     Left Ear: Hearing, ear canal and external ear normal. A middle ear effusion is present. There is no impacted cerumen.     Nose: Mucosal edema and rhinorrhea present. No nasal deformity, septal deviation, laceration, sinus tenderness, congestion, nasal septal hematoma, epistaxis or foreign body. Rhinorrhea is clear.     Right Turbinates: Enlarged and swollen. Not pale.     Left Turbinates: Enlarged and swollen. Not pale.     Right Sinus: Maxillary sinus tenderness present. No frontal sinus tenderness.     Left Sinus: Maxillary sinus tenderness present. No frontal sinus  tenderness.     Mouth/Throat:     Lips: Pink. No lesions.     Mouth: Mucous membranes are normal. Mucous membranes are moist. Mucous membranes are  not pale, not dry and not cyanotic. No lacerations, oral lesions or angioedema.     Dentition: No gingival swelling, dental caries, dental abscesses or gum lesions.     Tongue: No lesions. Tongue does not deviate from midline.     Palate: No mass and lesions.     Pharynx: Uvula midline. Pharyngeal swelling, posterior oropharyngeal edema and posterior oropharyngeal erythema present. No oropharyngeal exudate or uvula swelling.     Tonsils: No tonsillar exudate or tonsillar abscesses. 0 on the right. 0 on the left.     Comments: Cobblestoning posterior pharynx; bilateral allergic shiners; bilateral nasal turbinates edema/erythema clear yellow discharge; nasal sniffing noted during evaluation in exam room; bilateral TMs air fluid level slightly cloudy Eyes:     General: Lids are normal. Vision grossly intact. Gaze aligned appropriately. Allergic shiner present. No visual field deficit or scleral icterus.       Right eye: No foreign body, discharge or hordeolum.        Left eye: No foreign body, discharge or hordeolum.     Extraocular Movements: Extraocular movements intact and EOM normal.     Right eye: Normal extraocular motion and no nystagmus.     Left eye: Normal extraocular motion and no nystagmus.     Conjunctiva/sclera: Conjunctivae normal.     Right eye: Right conjunctiva is not injected. No chemosis, exudate or hemorrhage.    Left eye: Left conjunctiva is not injected. No chemosis, exudate or hemorrhage.    Pupils: Pupils are equal, round, and reactive to light. Pupils are equal.     Right eye: Pupil is round and reactive.     Left eye: Pupil is round and reactive.  Neck:     Thyroid: No thyroid mass or thyromegaly.     Trachea: Trachea and phonation normal. No tracheal tenderness or tracheal deviation.  Cardiovascular:     Rate and Rhythm: Regular rhythm. Tachycardia present.     Chest Wall: PMI is not displaced.     Pulses: Intact distal pulses.          Radial pulses are 2+ on the  right side and 2+ on the left side.     Heart sounds: Normal heart sounds, S1 normal and S2 normal. No murmur heard. No friction rub. No gallop.   Pulmonary:     Effort: Pulmonary effort is normal. No respiratory distress.     Breath sounds: Normal breath sounds and air entry. No stridor, decreased air movement or transmitted upper airway sounds. No decreased breath sounds, wheezing, rhonchi or rales.     Comments: Patient with intermittent dry nonharsh nonbarky cough during exam; wearing mask due to covid 19 pandemic; spoke full sentences without difficulty Abdominal:     General: Abdomen is flat. There is no distension.     Palpations: Abdomen is soft.  Musculoskeletal:        General: No swelling, tenderness, deformity, signs of injury or edema. Normal range of motion.     Right shoulder: No swelling, deformity, effusion or laceration. Normal range of motion.     Left shoulder: No swelling, deformity, effusion or laceration. Normal range of motion.     Right elbow: No swelling, deformity, effusion or lacerations. Normal range of motion.     Left elbow: No swelling, deformity, effusion or lacerations.  Normal range of motion.     Right hand: No swelling, deformity or lacerations. Normal range of motion.     Left hand: No swelling, deformity or lacerations. Normal range of motion.     Cervical back: Normal range of motion and neck supple. No swelling, edema, deformity, erythema, signs of trauma, lacerations, rigidity, spasms, torticollis, tenderness or crepitus. No pain with movement, spinous process tenderness or muscular tenderness. Normal range of motion.     Thoracic back: No swelling, edema, deformity, signs of trauma or lacerations. Normal range of motion.     Lumbar back: No swelling, edema, deformity, signs of trauma or lacerations. Normal range of motion.     Right hip: No deformity or lacerations. Normal range of motion.     Left hip: No deformity or lacerations. Normal range of  motion.     Right knee: No swelling, deformity, effusion or lacerations. Normal range of motion.     Left knee: No swelling, deformity, effusion or lacerations. Normal range of motion.  Lymphadenopathy:     Head:     Right side of head: No submental, submandibular, tonsillar, preauricular, posterior auricular or occipital adenopathy.     Left side of head: No submental, submandibular, tonsillar, preauricular, posterior auricular or occipital adenopathy.     Cervical: No cervical adenopathy.     Right cervical: No superficial, deep or posterior cervical adenopathy.    Left cervical: No superficial, deep or posterior cervical adenopathy.  Skin:    General: Skin is warm, dry and intact.     Capillary Refill: Capillary refill takes less than 2 seconds.     Coloration: Skin is not ashen, cyanotic, jaundiced, mottled, pale or sallow.     Findings: No abrasion, abscess, acne, bruising, burn, ecchymosis, erythema, signs of injury, laceration, lesion, petechiae, rash or wound.     Nails: There is no clubbing or cyanosis.  Neurological:     General: No focal deficit present.     Mental Status: He is alert and oriented to person, place, and time. Mental status is at baseline.     GCS: GCS eye subscore is 4. GCS verbal subscore is 5. GCS motor subscore is 6.     Cranial Nerves: Cranial nerves are intact. No cranial nerve deficit, dysarthria or facial asymmetry.     Sensory: Sensation is intact. No sensory deficit.     Motor: Motor function is intact. No weakness, tremor, atrophy, abnormal muscle tone or seizure activity.     Coordination: Coordination is intact. Coordination normal.     Gait: Gait is intact. Gait normal.     Comments: Gait sure and steady in warehouse and clinic; in/out of chair and on/off exam table without difficulty; bilateral hand grasp equal 5/5  Psychiatric:        Attention and Perception: Attention and perception normal.        Mood and Affect: Mood and affect, mood and  affect normal.        Speech: Speech normal.        Behavior: Behavior normal. Behavior is cooperative.        Thought Content: Thought content normal.        Cognition and Memory: Cognition, memory and cognition and memory normal.        Judgment: Judgment normal.           Assessment & Plan:  A-acute bronchitis and acute rhinosinusitis maxillary  P-Viral bronchitis suspected.  Refused tessalon pearles and albuterol inhaler.  Will  try low dose oral prednisone and stop mucinex restart dayquil/nyquil and may continue cough lozenges OTC per manufacturer's instructions.  Prednisone taper 10mg  take 2 tabs x 4 days po daily with breakfast #21 RF0 dispensed from [facility].  Discussed possible side effects increased/decreased appetite, difficulty sleeping, increased blood sugar, increased blood pressure and heart rate.  Patient denied coughing then vomiting or chest tightness/wheeze/shortness of breath.  Bronchitis simple, community acquired (probably metapneumovirus which has been circulating in community, flu on increase also.  Viruses are the most common cause of bronchial inflammation in otherwise healthy adults with acute bronchitis.  The appearance of sputum is not predictive of whether a bacterial infection is present.  Purulent sputum is most often caused by viral infections.  There are a small portion of those caused by non-viral agents being Mycoplama pneumonia.  Microscopic examination or C&S of sputum in the healthy adult with acute bronchitis is generally not helpful (usually negative or normal respiratory flora) other considerations being cough from upper respiratory tract infections, sinusitis or allergic syndromes (mild asthma or viral pneumonia).  Differential Diagnoses:  reactive airway disease (asthma, allergic aspergillosis (eosinophilia), chronic bronchitis, respiratory infection (sinusitis, common cold, pneumonia), congestive heart failure, reflux esophagitis, bronchogenic tumor, aspiration  syndromes and/or exposure to pulmonary irritants/smoke.  Patient to take omeprazole 20-40mg  po daily as history GERD could be exacerbating cough.  Ensure flonase, saline nasal and consider restarting OTC antihistamine of his choice along with dayquil/nyquil.  Without high fever, severe dyspnea, lack of physical findings or other risk factors, I will hold on a chest radiograph and CBC at this time.  I discussed that approximately 50% of patients with acute bronchitis have a cough that lasts up to three weeks, and 25% for over a month. Exitcare handouts on bronchitis and cough printed and given to patient.  ER if hemopthysis, SOB, worst chest pain of life.   Patient instructed to follow up if symptoms worsen.  Patient verbalized agreement and understanding of treatment plan and had no further questions at this time.  P2:  hand washing and cover cough  No quarantine or repeat covid testing at this time unless new symptoms/fever/chills.  HR notified.  Wear mask, hydrate with noncaffeinated fluids.  Elevated pulse due to OTC medicine and viral illness.  BS CTA.  Patient appears tired but otherwise stable.  Continue flonase 1 spray each nostril BID, saline 2 sprays each nostril q2h wa prn congestion.  Discussed prednsione 20mg  po daily x 4 days should help with sinus pressure along with nasal saline consistent use.  Dayquil/nyquil will also help dry up drip.  If cloudy tan, foul taste/smell to notify clinic staff as viral sinusitis may have changed to bacterial with these symptoms Denied personal or family history of ENT cancer.  Shower BID especially prior to bed. No evidence of systemic bacterial infection, non toxic and well hydrated.  I do not see where any further testing or imaging is necessary at this time.   I will suggest supportive care, rest, good hygiene and encourage the patient to take adequate fluids.  The patient is to return to clinic or EMERGENCY ROOM if symptoms worsen or change significantly.   Exitcare handouts on sinusitis and sinus rinse printed and given to patient.  Discussed medicine will not stop cold weather rhinitis only heated environment will do so. As weather is going to change again later this week sunny and back into 60s recommend he continues to take his OTC antihistamine of choice e.g. zyrtec/claritin 10mg  po  daily also.  Barometric pressure changes can cause sinus pressure.  Discussed post nasal drip irritating throat and middle ear cannot drain as eustachian tubes blocked at throat and may take 30 days to clear also.  Consider 1 tablespoon honey every 4 hours prn cough also.   Patient verbalized agreement and understanding of treatment plan and had no further questions at this time.   P2:  Hand washing and cover cough

## 2020-03-02 ENCOUNTER — Telehealth: Payer: Self-pay | Admitting: Registered Nurse

## 2020-03-02 NOTE — Telephone Encounter (Signed)
Patient seen in workcenter cough improved but having trouble sleeping when taking prednisone oral.  Feeling better; higher energy; saw PCM and wife sick worse than him with laryngitis/cough/congestion.  Patient to finish 4 day course and notify me if worsening when finished as may need to lengthen course and taper off.  Patient verbalized understanding information/instructions, agreed with plan of care and had no further questions at this time.

## 2020-03-16 ENCOUNTER — Encounter: Payer: Self-pay | Admitting: Registered Nurse

## 2020-03-16 ENCOUNTER — Other Ambulatory Visit: Payer: Self-pay

## 2020-03-16 ENCOUNTER — Ambulatory Visit: Payer: Self-pay | Admitting: Registered Nurse

## 2020-03-16 VITALS — BP 127/86 | HR 98 | Temp 97.5°F

## 2020-03-16 DIAGNOSIS — R12 Heartburn: Secondary | ICD-10-CM

## 2020-03-16 DIAGNOSIS — J302 Other seasonal allergic rhinitis: Secondary | ICD-10-CM

## 2020-03-16 DIAGNOSIS — F419 Anxiety disorder, unspecified: Secondary | ICD-10-CM

## 2020-03-16 DIAGNOSIS — R059 Cough, unspecified: Secondary | ICD-10-CM

## 2020-03-16 MED ORDER — OMEPRAZOLE 20 MG PO CPDR: 20.0000 mg | DELAYED_RELEASE_CAPSULE | Freq: Every day | ORAL | 0 refills | Status: DC

## 2020-03-16 NOTE — Patient Instructions (Signed)
Food Choices for Gastroesophageal Reflux Disease, Adult When you have gastroesophageal reflux disease (GERD), the foods you eat and your eating habits are very important. Choosing the right foods can help ease the discomfort of GERD. Consider working with a diet and nutrition specialist (dietitian) to help you make healthy food choices. What general guidelines should I follow?  Eating plan  Choose healthy foods low in fat, such as fruits, vegetables, whole grains, low-fat dairy products, and lean meat, fish, and poultry.  Eat frequent, small meals instead of three large meals each day. Eat your meals slowly, in a relaxed setting. Avoid bending over or lying down until 2-3 hours after eating.  Limit high-fat foods such as fatty meats or fried foods.  Limit your intake of oils, butter, and shortening to less than 8 teaspoons each day.  Avoid the following: ? Foods that cause symptoms. These may be different for different people. Keep a food diary to keep track of foods that cause symptoms. ? Alcohol. ? Drinking large amounts of liquid with meals. ? Eating meals during the 2-3 hours before bed.  Cook foods using methods other than frying. This may include baking, grilling, or broiling. Lifestyle  Maintain a healthy weight. Ask your health care provider what weight is healthy for you. If you need to lose weight, work with your health care provider to do so safely.  Exercise for at least 30 minutes on 5 or more days each week, or as told by your health care provider.  Avoid wearing clothes that fit tightly around your waist and chest.  Do not use any products that contain nicotine or tobacco, such as cigarettes and e-cigarettes. If you need help quitting, ask your health care provider.  Sleep with the head of your bed raised. Use a wedge under the mattress or blocks under the bed frame to raise the head of the bed. What foods are not recommended? The items listed may not be a complete  list. Talk with your dietitian about what dietary choices are best for you. Grains Pastries or quick breads with added fat. French toast. Vegetables Deep fried vegetables. French fries. Any vegetables prepared with added fat. Any vegetables that cause symptoms. For some people this may include tomatoes and tomato products, chili peppers, onions and garlic, and horseradish. Fruits Any fruits prepared with added fat. Any fruits that cause symptoms. For some people this may include citrus fruits, such as oranges, grapefruit, pineapple, and lemons. Meats and other protein foods High-fat meats, such as fatty beef or pork, hot dogs, ribs, ham, sausage, salami and bacon. Fried meat or protein, including fried fish and fried chicken. Nuts and nut butters. Dairy Whole milk and chocolate milk. Sour cream. Cream. Ice cream. Cream cheese. Milk shakes. Beverages Coffee and tea, with or without caffeine. Carbonated beverages. Sodas. Energy drinks. Fruit juice made with acidic fruits (such as orange or grapefruit). Tomato juice. Alcoholic drinks. Fats and oils Butter. Margarine. Shortening. Ghee. Sweets and desserts Chocolate and cocoa. Donuts. Seasoning and other foods Pepper. Peppermint and spearmint. Any condiments, herbs, or seasonings that cause symptoms. For some people, this may include curry, hot sauce, or vinegar-based salad dressings. Summary  When you have gastroesophageal reflux disease (GERD), food and lifestyle choices are very important to help ease the discomfort of GERD.  Eat frequent, small meals instead of three large meals each day. Eat your meals slowly, in a relaxed setting. Avoid bending over or lying down until 2-3 hours after eating.  Limit high-fat   foods such as fatty meat or fried foods. This information is not intended to replace advice given to you by your health care provider. Make sure you discuss any questions you have with your health care provider. Document Revised:  06/18/2018 Document Reviewed: 02/27/2016 Elsevier Patient Education  2020 [employer] Cough, Adult Coughing is a reflex that clears your throat and your airways (respiratory system). Coughing helps to heal and protect your lungs. It is normal to cough occasionally, but a cough that happens with other symptoms or lasts a long time may be a sign of a condition that needs treatment. An acute cough may only last 2-3 weeks, while a chronic cough may last 8 or more weeks. Coughing is commonly caused by:  Infection of the respiratory systemby viruses or bacteria.  Breathing in substances that irritate your lungs.  Allergies.  Asthma.  Mucus that runs down the back of your throat (postnasal drip).  Smoking.  Acid backing up from the stomach into the esophagus (gastroesophageal reflux).  Certain medicines.  Chronic lung problems.  Other medical conditions such as heart failure or a blood clot in the lung (pulmonary embolism). Follow these instructions at home: Medicines  Take over-the-counter and prescription medicines only as told by your health care provider.  Talk with your health care provider before you take a cough suppressant medicine. Lifestyle   Avoid cigarette smoke. Do not use any products that contain nicotine or tobacco, such as cigarettes, e-cigarettes, and chewing tobacco. If you need help quitting, ask your health care provider.  Drink enough fluid to keep your urine pale yellow.  Avoid caffeine.  Do not drink alcohol if your health care provider tells you not to drink. General instructions   Pay close attention to changes in your cough. Tell your health care provider about them.  Always cover your mouth when you cough.  Avoid things that make you cough, such as perfume, candles, cleaning products, or campfire or tobacco smoke.  If the air is dry, use a cool mist vaporizer or humidifier in your bedroom or your home to help loosen secretions.  If your  cough is worse at night, try to sleep in a semi-upright position.  Rest as needed.  Keep all follow-up visits as told by your health care provider. This is important. Contact a health care provider if you:  Have new symptoms.  Cough up pus.  Have a cough that does not get better after 2-3 weeks or gets worse.  Cannot control your cough with cough suppressant medicines and you are losing sleep.  Have pain that gets worse or pain that is not helped with medicine.  Have a fever.  Have unexplained weight loss.  Have night sweats. Get help right away if:  You cough up blood.  You have difficulty breathing.  Your heartbeat is very fast. These symptoms may represent a serious problem that is an emergency. Do not wait to see if the symptoms will go away. Get medical help right away. Call your local emergency services (911 in the [place] Do not drive yourself to the hospital. Summary  Coughing is a reflex that clears your throat and your airways. It is normal to cough occasionally, but a cough that happens with other symptoms or lasts a long time may be a sign of a condition that needs treatment.  Take over-the-counter and prescription medicines only as told by your health care provider.  Always cover your mouth when you cough.  Contact a health care provider  if you have new symptoms or a cough that does not get better after 2-3 weeks or gets worse. This information is not intended to replace advice given to you by your health care provider. Make sure you discuss any questions you have with your health care provider. Document Revised: 03/16/2018 Document Reviewed: 03/16/2018 Elsevier Patient Education  2020 [employer].

## 2020-03-16 NOTE — Progress Notes (Signed)
Subjective:    Patient ID: Lucas Barrett, male    DOB: 09-26-1985, 35 y.o.   MRN: [id number]  35y/o Caucasian married established male pt c/o chronic cough x1 year. Varies between dry and productive. Some days infrequent, others frequent and can keep him up at night. Has tested for Covid multiple times with all negative tests. Last test mid December.  Others in work center out on covid quarantine/testing at this time.  Was seen last month and given short course prednisone.  Patient reported he had to stop early due to insomnia/worsening anxiety symptoms.  Cough drops helping not using any OTC cough liquids.  Denied wheezing/dyspnea/shortness of breath.  Heartburn has flared up over the holidays.  "My food choices were to blame"  A little sinus pressure/pain occasionally but not all the time.  Denied post nasal drip.  Not using nasal sprays daily only prn.   Patient will be on leave of absence over the next month for his mental and physical health from work.  Will follow up via telephone with him next week for re-evaluation as he will not be allowed onsite during leave of absence.  Patient reports stressors work/home/new baby, hasn't been getting to the gym or exercising due to no energy, just tired.  Established with mental health provider and PCM.  Denied HI/SI.     Review of Systems  Constitutional: Positive for fatigue. Negative for activity change, appetite change, chills, diaphoresis and fever.  HENT: Positive for congestion, postnasal drip and sinus pressure. Negative for dental problem, drooling, ear discharge, ear pain, facial swelling, hearing loss, mouth sores, nosebleeds, rhinorrhea, sinus pain, sneezing, sore throat, tinnitus, trouble swallowing and voice change.   Eyes: Negative for photophobia and visual disturbance.  Respiratory: Positive for cough. Negative for choking, shortness of breath, wheezing and stridor.   Cardiovascular: Negative for chest pain.  Gastrointestinal: Negative  for diarrhea, nausea and vomiting.  Endocrine: Negative for cold intolerance and heat intolerance.  Genitourinary: Negative for difficulty urinating.  Musculoskeletal: Negative for arthralgias, gait problem, myalgias, neck pain and neck stiffness.  Skin: Negative for rash.  Allergic/Immunologic: Positive for environmental allergies. Negative for food allergies.  Neurological: Negative for dizziness, tremors, seizures, syncope, facial asymmetry, speech difficulty, light-headedness, numbness and headaches.  Hematological: Negative for adenopathy. Does not bruise/bleed easily.  Psychiatric/Behavioral: Negative for agitation, confusion and sleep disturbance.       Objective:   Physical Exam Vitals and nursing note reviewed.  Constitutional:      General: He is awake and active. He is not in acute distress.Vital signs are normal.     Appearance: Normal appearance. He is well-developed, well-groomed, normal weight and well-nourished. He is not ill-appearing, toxic-appearing, sickly-appearing or diaphoretic.  HENT:     Head: Normocephalic and atraumatic.     Jaw: There is normal jaw occlusion. No trismus.     Salivary Glands: Right salivary gland is not diffusely enlarged or tender. Left salivary gland is not diffusely enlarged or tender.     Right Ear: Hearing, ear canal and external ear normal. A middle ear effusion is present. There is no impacted cerumen.     Left Ear: Hearing, ear canal and external ear normal. A middle ear effusion is present. There is no impacted cerumen.     Nose: No nasal deformity, septal deviation, signs of injury, laceration, nasal tenderness, sinus tenderness, mucosal edema, congestion, rhinorrhea, nasal septal hematoma, epistaxis or foreign body.     Right Turbinates: Not enlarged, swollen or pale.  Left Turbinates: Not enlarged, swollen or pale.     Right Sinus: Maxillary sinus tenderness present. No frontal sinus tenderness.     Left Sinus: Maxillary sinus  tenderness present. No frontal sinus tenderness.     Mouth/Throat:     Lips: Pink. No lesions.     Mouth: Mucous membranes are normal. Mucous membranes are moist. Mucous membranes are not pale, not dry and not cyanotic. No lacerations, oral lesions or angioedema.     Dentition: Normal dentition. Does not have dentures. No gingival swelling, dental caries, dental abscesses or gum lesions.     Tongue: No lesions. Tongue does not deviate from midline.     Palate: No mass and lesions.     Pharynx: Uvula midline. Posterior oropharyngeal edema and posterior oropharyngeal erythema present. No oropharyngeal exudate or uvula swelling.     Tonsils: No tonsillar exudate or tonsillar abscesses.     Comments: Minimal cobblestoning posterior pharynx; bilateral TMs air fluid level clear; no mucous noted nasal turbinates; mild allergic shiners most pronounced medially bilaterally; mild tender to palpation maxillary sinuses bilaterally Eyes:     General: Lids are normal. Vision grossly intact. Gaze aligned appropriately. Allergic shiner present. No visual field deficit or scleral icterus.       Right eye: No foreign body, discharge or hordeolum.        Left eye: No foreign body, discharge or hordeolum.     Extraocular Movements: Extraocular movements intact and EOM normal.     Right eye: Normal extraocular motion and no nystagmus.     Left eye: Normal extraocular motion and no nystagmus.     Conjunctiva/sclera: Conjunctivae normal.     Right eye: Right conjunctiva is not injected. No chemosis, exudate or hemorrhage.    Left eye: Left conjunctiva is not injected. No chemosis, exudate or hemorrhage.    Pupils: Pupils are equal, round, and reactive to light. Pupils are equal.     Right eye: Pupil is round and reactive.     Left eye: Pupil is round and reactive.  Neck:     Thyroid: No thyroid mass or thyromegaly.     Trachea: Trachea and phonation normal. No tracheal tenderness or tracheal deviation.   Cardiovascular:     Rate and Rhythm: Normal rate and regular rhythm.     Pulses: Normal pulses and intact distal pulses.          Radial pulses are 2+ on the right side and 2+ on the left side.  Pulmonary:     Effort: Pulmonary effort is normal. No respiratory distress.     Breath sounds: Normal breath sounds and air entry. No stridor, decreased air movement or transmitted upper airway sounds. No decreased breath sounds, wheezing, rhonchi or rales.     Comments: Spoke full sentences without difficulty no cough observed in exam room; wearing mask due to covid 19 pandemic Abdominal:     General: Abdomen is flat. There is no distension.     Palpations: Abdomen is soft.  Musculoskeletal:        General: No swelling, tenderness, deformity, signs of injury or edema. Normal range of motion.     Right shoulder: Normal.     Right elbow: Normal.     Left elbow: Normal.     Right hand: Normal.     Left hand: Normal.     Cervical back: Normal, normal range of motion and neck supple. No swelling, edema, deformity, erythema, signs of trauma, lacerations, rigidity, torticollis or  crepitus. No pain with movement. Normal range of motion.     Thoracic back: Normal.     Lumbar back: Normal.     Right hip: Normal.     Left hip: Normal.     Right knee: Normal.     Left knee: Normal.     Right lower leg: Normal. No edema.     Left lower leg: Normal. No edema.  Lymphadenopathy:     Head:     Right side of head: No submental, submandibular, tonsillar, preauricular, posterior auricular or occipital adenopathy.     Left side of head: No submental, submandibular, tonsillar, preauricular, posterior auricular or occipital adenopathy.     Cervical: No cervical adenopathy.     Right cervical: No superficial, deep or posterior cervical adenopathy.    Left cervical: No superficial, deep or posterior cervical adenopathy.  Skin:    General: Skin is warm, dry and intact.     Capillary Refill: Capillary refill  takes less than 2 seconds.     Coloration: Skin is not ashen, cyanotic, jaundiced, mottled, pale or sallow.     Findings: No abrasion, abscess, acne, bruising, burn, ecchymosis, erythema, signs of injury, laceration, lesion, petechiae, rash or wound.     Nails: There is no clubbing or cyanosis.  Neurological:     General: No focal deficit present.     Mental Status: He is alert and oriented to person, place, and time. Mental status is at baseline.     GCS: GCS eye subscore is 4. GCS verbal subscore is 5. GCS motor subscore is 6.     Cranial Nerves: Cranial nerves are intact. No cranial nerve deficit, dysarthria or facial asymmetry.     Sensory: Sensation is intact. No sensory deficit.     Motor: Motor function is intact. No weakness, tremor, atrophy, abnormal muscle tone or seizure activity.     Coordination: Coordination is intact. Coordination normal.     Gait: Gait is intact. Gait normal.     Comments: In/out of chair and on/off exam table without difficulty; gait sure and steady in clinic; bilateral hand grasp equal 5/5  Psychiatric:        Attention and Perception: Attention and perception normal.        Mood and Affect: Mood and affect, mood and affect normal.        Speech: Speech normal.        Behavior: Behavior normal. Behavior is cooperative.        Thought Content: Thought content normal.        Cognition and Memory: Cognition, memory and cognition and memory normal.        Judgment: Judgment normal.           Assessment & Plan:  A-cough, anxiety, GERD, seasonal allergic rhinitis unspecified  P-Will follow up via telephone in one week.  Patient to monitor his symptoms and consistently take 40+20mg  omeprazole may take all at one time with food or split am/pm.  Will look for hypersecretory GI handout and email to patient if I am able to find a useful one for him online as [facility] does not have one.  Discussed with patient I suspect cough is a mix of GERD and post nasal  drip.  Per epocrates hypersecretory conditions may require 60mg  daily dosing omeprazole DR po with food.  Dispensed 20mg  po daily #90 RF0 to take for one week and monitor heartburn/cough symptoms.  Discussed GI has tools to do actual measurements of  stomach acid which we do not have in this clinic.  Patient to also take his allergy regimen every day  Patient may use normal saline nasal spray 2 sprays each nostril q2h wa as needed. flonase 1mcg 1 spray each nostril BID or 2 sprays each nostril once a day if that will help him to remember daily dosing.    Patient denied personal or family history of ENT cancer.  OTC antihistamine of choice zyrtec 10mg  po daily.  Avoid triggers if possible.  Shower prior to bedtime if exposed to triggers.  If allergic dust/dust mites recommend mattress/pillow covers/encasements; washing linens, vacuuming, sweeping, dusting weekly.  Call or return to clinic as needed if these symptoms worsen or fail to improve as anticipated.  May continue cough drops prn.  Modify diet to avoid heartburn triggers. Anti-reflux measures such as raising the head of the bed, avoiding tight clothing or belts, avoiding eating late at night and not lying down shortly after mealtime and achieving weight loss were discussed. Avoid ASA, NSAID's, caffeine, peppermints, alcohol and tobacco. OTC H2 blockers and/or antacids are often very helpful for PRN use.  However, for chronic or daily symptoms, prescription strength H2 blockers or a trial of PPI's should be used.  Patient should alert me if there are persistent symptoms, dysphagia, weight loss or GI bleeding (bright red or black). Follow up with clinic provider or PCM if symptoms persist despite therapy. [facility] handout food choices for gerd printed and given to patient.  Patient verbalized agreement and understanding of treatment plan and had no further questions at this time.      P2:  Diet, Food avoidance that aggravate condition, and Fitness    Medication recently changed by PCM  Discussed returning to the gym or at least getting out in the sunshine and taking a walk every day while on leave of absence.  Discussed sleep hygiene, avoiding sleep deprivation, skipping meals, getting dehydrated.  Sleep works best if maintaining routine. No S/I or H/I, plan, or ideation. Patient is reliable.  Follow up with his providers as scheduled.  Patient verbalized understanding of information/instructions, agreed with plan of care and had no further questions at this time. P2: Diet and Exercise. Stress reduction.

## 2022-12-30 ENCOUNTER — Ambulatory Visit: Payer: Medicaid Other

## 2022-12-30 ENCOUNTER — Ambulatory Visit
Admission: EM | Admit: 2022-12-30 | Discharge: 2022-12-30 | Disposition: A | Discharge status: Home / Self Care | Payer: Medicaid Other | Attending: Internal Medicine | Admitting: Internal Medicine

## 2022-12-30 DIAGNOSIS — M5412 Radiculopathy, cervical region: Secondary | ICD-10-CM

## 2022-12-30 DIAGNOSIS — M533 Sacrococcygeal disorders, not elsewhere classified: Secondary | ICD-10-CM | POA: Diagnosis not present

## 2022-12-30 MED ORDER — PREDNISONE 20 MG PO TABS: ORAL_TABLET | ORAL | 0 refills | Status: DC

## 2022-12-30 MED ORDER — CYCLOBENZAPRINE HCL 5 MG PO TABS: 5.0000 mg | ORAL_TABLET | Freq: Three times a day (TID) | ORAL | 0 refills | Status: AC | PRN

## 2022-12-30 NOTE — ED Triage Notes (Signed)
Pt reports bilateral scapular pain, back pain "whole back", bilateral elbow pain and tingling in fingers tips started this morning when he woke up. Tens unit, ibuprofen, pain strips gives no relief. Pt reports he felt 1 week ago and injured the "tailbone, still hurts.

## 2022-12-30 NOTE — ED Provider Notes (Signed)
[facility]  Note:  This document was prepared using Conservation officer, historic buildings and may include unintentional dictation errors.  MRN: [id number] DOB: 1985/03/20  Subjective:   Karmyne Paganini is a 37 y.o. male presenting for 1 day history of severe neck and upper back pain that radiates down toward his low back.  Also feels pain radiating toward the left arm, elbow, tingling in the fingers and hands.  Injured himself by falling on his tailbone a week ago.  Did not seek treatment then.  Symptoms were generally mild.  For his current back pain, has used TENS unit, ibuprofen, topical treatments and pain strips.  Denies history of severe back injuries, musculoskeletal disorders.  No current facility-administered medications for this encounter.  Current Outpatient Medications:    acetaminophen (TYLENOL) 500 MG tablet, Take 1,000 mg by mouth every 6 (six) hours as needed for headache or pain (pain)., Disp: , Rfl:    ALPRAZolam (XANAX) 0.5 MG tablet, Take 0.5 mg by mouth daily as needed., Disp: , Rfl:    calcium elemental as carbonate (BARIATRIC TUMS ULTRA) 400 MG chewable tablet, Chew 1,000 mg by mouth 2 (two) times daily as needed for heartburn (indigestion)., Disp: , Rfl:    cetirizine (ZYRTEC) 10 MG tablet, Take 1 tablet by mouth daily., Disp: , Rfl:    [employer] M[facility] COUGH DROPS MT, Use as directed 1 lozenge in the mouth or throat as needed (cough)., Disp: , Rfl:    fluticasone (FLONASE) 50 MCG/ACT nasal spray, Place 1 spray into both nostrils 2 (two) times daily., Disp: 16 g, Rfl: 0   guaiFENesin (MUCINEX) 600 MG 12 hr tablet, Take 600 mg by mouth 2 (two) times daily as needed for to loosen phlegm or cough., Disp: , Rfl:    ibuprofen (ADVIL) 800 MG tablet, Take 1 tablet (800 mg total) by mouth 3 (three) times daily., Disp: 21 tablet, Rfl: 0   Multiple Vitamin (MULTIVITAMIN WITH MINERALS) TABS tablet, Take 1 tablet by mouth daily., Disp: , Rfl:    omeprazole (PRILOSEC)  20 MG capsule, Take 1 capsule (20 mg total) by mouth daily for 7 days. With 40mg  capsule, Disp: 90 capsule, Rfl: 0   omeprazole (PRILOSEC) 40 MG capsule, Take 1 capsule (40 mg total) by mouth daily., Disp: 90 capsule, Rfl: 0   Pseudoephedrine-APAP-DM (DAYQUIL MULTI-SYMPTOM PO), Take 1 tablet by mouth as needed (cough)., Disp: , Rfl:    QUEtiapine (SEROQUEL) 50 MG tablet, Take 50 mg by mouth at bedtime., Disp: , Rfl:    sodium chloride (OCEAN) 0.65 % SOLN nasal spray, Place 2 sprays into both nostrils every 2 (two) hours while awake., Disp: , Rfl: 0   sucralfate (CARAFATE) 1 g tablet, TAKE 1 TABLET BY MOUTH ON EMPTY STOMACH TWICE A DAY AS NEEDED FOR 5 DAYS per flare up for 30 days, Disp: , Rfl:    SUMAtriptan (IMITREX) 100 MG tablet, Take 100 mg by mouth. at onset migraine and may repeat once in 2 hours as needed, Disp: , Rfl:    venlafaxine XR (EFFEXOR-XR) 75 MG 24 hr capsule, Take 75 mg by mouth daily., Disp: , Rfl:    Allergies  Allergen Reactions   Buspirone Other (See Comments)    Other reaction(s): dizziness   Fluoxetine Other (See Comments)   Other Other (See Comments)    Past Medical History:  Diagnosis Date   Anxiety    Hypertension      History reviewed. No pertinent surgical history.  Family  History  Problem Relation Age of Onset   Hypertension Mother     Social History   Tobacco Use   Smoking status: Never   Smokeless tobacco: Never  Vaping Use   Vaping status: Never Used  Substance Use Topics   Alcohol use: No    Alcohol/week: 0.0 standard drinks of alcohol   Drug use: No    ROS   Objective:   Vitals: BP (!) 147/107 (BP Location: Right Arm)   Pulse 92   Temp (!) 97.4 F (36.3 C) (Oral)   Resp 20   SpO2 99%   Physical Exam Constitutional:      General: He is not in acute distress.    Appearance: Normal appearance. He is well-developed and normal weight. He is not ill-appearing, toxic-appearing or diaphoretic.  HENT:     Head: Normocephalic and  atraumatic.     Right Ear: External ear normal.     Left Ear: External ear normal.     Nose: Nose normal.     Mouth/Throat:     Pharynx: Oropharynx is clear.  Eyes:     General: No scleral icterus.       Right eye: No discharge.        Left eye: No discharge.     Extraocular Movements: Extraocular movements intact.  Cardiovascular:     Rate and Rhythm: Normal rate.  Pulmonary:     Effort: Pulmonary effort is normal.  Musculoskeletal:     Cervical back: Spasms and tenderness (positive Spurling maneuver to the left and right; tenderness over paraspinal muscles, trapezius bilaterally) present. No swelling, edema, deformity, erythema, signs of trauma, lacerations, rigidity, torticollis, bony tenderness or crepitus. Pain with movement present. Decreased range of motion.     Thoracic back: Spasms and tenderness (over paraspinal muscles) present. No swelling, edema, deformity, signs of trauma, lacerations or bony tenderness. Normal range of motion. No scoliosis.     Lumbar back: No swelling, edema, deformity, signs of trauma, lacerations, spasms, tenderness or bony tenderness. Normal range of motion. Negative right straight leg raise test and negative left straight leg raise test. No scoliosis.  Neurological:     Mental Status: He is alert and oriented to person, place, and time.  Psychiatric:        Mood and Affect: Mood normal.        Behavior: Behavior normal.        Thought Content: Thought content normal.        Judgment: Judgment normal.    DG Cervical Spine Complete  Result Date: 12/30/2022 CLINICAL DATA:  Neck pain. EXAM: CERVICAL SPINE - COMPLETE 4+ VIEW COMPARISON:  None Available. FINDINGS: There is no evidence of cervical spine fracture or prevertebral soft tissue swelling. Alignment is normal. Mild anterior osteophyte formation is noted anteriorly at C4-5 and C5-6. No significant neural foraminal stenosis is noted. IMPRESSION: Mild multilevel degenerative changes noted above. No  acute abnormality seen. Electronically Signed   By: Lupita Raider M.D.   On: 12/30/2022 15:39   DG Sacrum/Coccyx  Result Date: 12/30/2022 CLINICAL DATA:  Tail bone pain after fall last week. EXAM: SACRUM AND COCCYX - 2+ VIEW COMPARISON:  None Available. FINDINGS: There is no evidence of fracture or other focal bone lesions. IMPRESSION: Negative. Electronically Signed   By: Lupita Raider M.D.   On: 12/30/2022 15:36    Assessment and Plan :   PDMP not reviewed this encounter.  1. Cervical radiculopathy   2. Coccygeal pain  Given the cervical radiculopathy, recommended a 9-day course of prednisone.  Discussed neck and back care.  Emphasized need for follow-up with a spine specialist.  Patient would benefit from consideration and workup with an MRI.  Counseled patient on potential for adverse effects with medications prescribed/recommended today, ER and return-to-clinic precautions discussed, patient verbalized understanding.    Wallis Bamberg, [place] 12/31/22 [phone]

## 2022-12-30 NOTE — Discharge Instructions (Addendum)
Will update you with your xray results later today. Follow up with the spine clinic. Use Tylenol in addition to the prednisone, the steroid. Use the muscle relaxant to help with the spasms.

## 2023-01-10 ENCOUNTER — Telehealth: Payer: Medicaid Other | Admitting: Physician Assistant

## 2023-01-10 DIAGNOSIS — J020 Streptococcal pharyngitis: Secondary | ICD-10-CM

## 2023-01-10 MED ORDER — AMOXICILLIN 500 MG PO CAPS
500.0000 mg | ORAL_CAPSULE | Freq: Two times a day (BID) | ORAL | 0 refills | Status: AC
Start: 2023-01-10 — End: 2023-01-20

## 2023-01-10 NOTE — Progress Notes (Signed)

## 2023-05-09 ENCOUNTER — Ambulatory Visit: Payer: Medicaid Other

## 2023-05-16 ENCOUNTER — Ambulatory Visit: Payer: Medicaid Other

## 2023-05-17 ENCOUNTER — Telehealth: Admitting: Physician Assistant

## 2023-05-17 DIAGNOSIS — J060 Acute laryngopharyngitis: Secondary | ICD-10-CM

## 2023-05-17 NOTE — Progress Notes (Signed)
  Because of the duration of your symptoms, I feel your condition warrants further evaluation and I recommend that you be seen in a face-to-face visit.   NOTE: There will be NO CHARGE for this E-Visit   If you are having a true medical emergency, please call 911.     For an urgent face to face visit, [facility] has multiple urgent care centers for your convenience.  Click the link below for the full list of locations and hours, walk-in wait times, appointment scheduling options and driving directions:  Urgent Care - [place], [place], [place], [place], [place], [place]  [place]     Your MyChart E-visit questionnaire answers were reviewed by a board certified advanced clinical practitioner to complete your personal care plan based on your specific symptoms.    Thank you for using e-Visits.

## 2024-01-25 ENCOUNTER — Telehealth: Admitting: Physician Assistant

## 2024-01-25 DIAGNOSIS — R12 Heartburn: Secondary | ICD-10-CM

## 2024-01-25 MED ORDER — PANTOPRAZOLE SODIUM 40 MG PO TBEC
40.0000 mg | DELAYED_RELEASE_TABLET | Freq: Every day | ORAL | 1 refills | Status: DC
Start: 2024-01-25 — End: 2024-02-01

## 2024-01-25 NOTE — Progress Notes (Signed)
 We are sorry that you are not feeling well.  Here is how we plan to help!  Based on what you shared with me it looks like you most likely have Gastroesophageal Reflux Disease (GERD)  Gastroesophageal reflux disease (GERD) happens when acid from your stomach flows up into the esophagus.  When acid comes in contact with the esophagus, the acid causes sorenss (inflammation) in the esophagus.  Over time, GERD may create small holes (ulcers) in the lining of the esophagus.  I have prescribed Pantoprazole 40mg  Take 1 tablet daily 30 minutes before the 1st meal of the day.  Your symptoms should improve in the next day or two.  You can use antacids as needed until symptoms resolve.  Call us  if your heartburn worsens, you have trouble swallowing, weight loss, spitting up blood or recurrent vomiting.  Home Care: May include lifestyle changes such as weight loss, quitting smoking and alcohol consumption Avoid foods and drinks that make your symptoms worse, such as: Caffeine or alcoholic drinks Chocolate Peppermint or mint flavorings Garlic and onions Spicy foods Citrus fruits, such as oranges, lemons, or limes Tomato-based foods such as sauce, chili, salsa and pizza Fried and fatty foods Avoid lying down for 3 hours prior to your bedtime or prior to taking a nap Eat small, frequent meals instead of a large meals Wear loose-fitting clothing.  Do not wear anything tight around your waist that causes pressure on your stomach. Raise the head of your bed 6 to 8 inches with wood blocks to help you sleep.  Extra pillows will not help.  Seek Help Right Away If: You have pain in your arms, neck, jaw, teeth or back Your pain increases or changes in intensity or duration You develop nausea, vomiting or sweating (diaphoresis) You develop shortness of breath or you faint Your vomit is green, yellow, black or looks like coffee grounds or blood Your stool is red, bloody or black  These symptoms could be  signs of other problems, such as heart disease, gastric bleeding or esophageal bleeding.  Make sure you : Understand these instructions. Will watch your condition. Will get help right away if you are not doing well or get worse.  Your e-visit answers were reviewed by a board certified advanced clinical practitioner to complete your personal care plan.  Depending on the condition, your plan could have included both over the counter or prescription medications.  If there is a problem please reply  once you have received a response from your provider.  Your safety is important to us .  If you have drug allergies check your prescription carefully.    You can use MyChart to ask questions about today's visit, request a non-urgent call back, or ask for a work or school excuse for 24 hours related to this e-Visit. If it has been greater than 24 hours you will need to follow up with your provider, or enter a new e-Visit to address those concerns.  You will get an e-mail in the next two days asking about your experience.  I hope that your e-visit has been valuable and will speed your recovery. Thank you for using e-visits.  I have spent 5 minutes in review of e-visit questionnaire, review and updating patient chart, medical decision making and response to patient.   Delon CHRISTELLA Dickinson, PA-C

## 2024-02-01 ENCOUNTER — Telehealth: Admitting: Nurse Practitioner

## 2024-02-01 DIAGNOSIS — R12 Heartburn: Secondary | ICD-10-CM

## 2024-02-01 MED ORDER — PANTOPRAZOLE SODIUM 40 MG PO TBEC: 40.0000 mg | DELAYED_RELEASE_TABLET | Freq: Every day | ORAL | 0 refills | Status: AC

## 2024-02-01 NOTE — Progress Notes (Signed)
 Unfortunately we do not prescribed sucralfate . I would recommend continuing gaviscon and increasing your pantoprazole  to twice a day for the next 7 days to see if that would help. You will also likely need to establish care with a PCP for sucralfate  and future continued refills of pantoprazole . Please see the number below for assistance with getting established. I have refilled you pantoprazole  since you will need to increase it temporarily.     If you do not have a PCP, [facility] offers a free physician referral service available at [phone]. Our trained staff has the experience, knowledge and resources to put you in touch with a physician who is right for you.    If you are having a true medical emergency please call 911.   Your e-visit answers were reviewed by a board certified advanced clinical practitioner to complete your personal care plan.  Thank you for using e-Visits.

## 2024-04-16 ENCOUNTER — Other Ambulatory Visit: Payer: Self-pay | Admitting: Nurse Practitioner

## 2024-04-16 DIAGNOSIS — R12 Heartburn: Secondary | ICD-10-CM
